# Patient Record
Sex: Male | Born: 1970 | Race: Black or African American | Hispanic: No | Marital: Single | Smoking: Former smoker
Health system: Southern US, Community
[De-identification: ages and names within clinical notes are randomized; demographics above are authoritative.]

## PROBLEM LIST (undated history)

## (undated) DIAGNOSIS — I10 Essential (primary) hypertension: Secondary | ICD-10-CM

## (undated) DIAGNOSIS — K219 Gastro-esophageal reflux disease without esophagitis: Secondary | ICD-10-CM

## (undated) DIAGNOSIS — G839 Paralytic syndrome, unspecified: Secondary | ICD-10-CM

## (undated) DIAGNOSIS — K222 Esophageal obstruction: Secondary | ICD-10-CM

## (undated) HISTORY — DX: Essential (primary) hypertension: I10

## (undated) HISTORY — DX: Esophageal obstruction: K22.2

## (undated) HISTORY — PX: THORACIC OUTLET SURGERY: SHX2502

## (undated) HISTORY — PX: OTHER SURGICAL HISTORY: SHX169

## (undated) HISTORY — PX: REPAIR CRANIAL DEFECT SIMPLE: SUR683

---

## 1998-04-12 ENCOUNTER — Emergency Department (HOSPITAL_COMMUNITY): Admission: EM | Admit: 1998-04-12 | Discharge: 1998-04-12 | Payer: Self-pay | Admitting: Emergency Medicine

## 1999-07-12 ENCOUNTER — Emergency Department (HOSPITAL_COMMUNITY): Admission: EM | Admit: 1999-07-12 | Discharge: 1999-07-12 | Payer: Self-pay | Admitting: Emergency Medicine

## 2000-09-04 ENCOUNTER — Emergency Department (HOSPITAL_COMMUNITY): Admission: EM | Admit: 2000-09-04 | Discharge: 2000-09-04 | Payer: Self-pay | Admitting: Emergency Medicine

## 2001-02-16 ENCOUNTER — Encounter: Payer: Self-pay | Admitting: Internal Medicine

## 2001-02-16 ENCOUNTER — Emergency Department (HOSPITAL_COMMUNITY): Admission: EM | Admit: 2001-02-16 | Discharge: 2001-02-16 | Payer: Self-pay | Admitting: Internal Medicine

## 2002-01-18 ENCOUNTER — Emergency Department (HOSPITAL_COMMUNITY): Admission: EM | Admit: 2002-01-18 | Discharge: 2002-01-18 | Payer: Self-pay | Admitting: Emergency Medicine

## 2002-12-22 ENCOUNTER — Emergency Department (HOSPITAL_COMMUNITY): Admission: EM | Admit: 2002-12-22 | Discharge: 2002-12-23 | Payer: Self-pay

## 2003-07-15 ENCOUNTER — Emergency Department (HOSPITAL_COMMUNITY): Admission: EM | Admit: 2003-07-15 | Discharge: 2003-07-15 | Payer: Self-pay | Admitting: Emergency Medicine

## 2010-01-23 ENCOUNTER — Emergency Department (HOSPITAL_COMMUNITY): Admission: EM | Admit: 2010-01-23 | Discharge: 2010-01-24 | Payer: Self-pay | Admitting: Emergency Medicine

## 2010-07-19 ENCOUNTER — Emergency Department (HOSPITAL_COMMUNITY): Admission: EM | Admit: 2010-07-19 | Discharge: 2010-07-19 | Payer: Self-pay | Admitting: Emergency Medicine

## 2010-11-02 LAB — DIFFERENTIAL
Basophils Absolute: 0 10*3/uL (ref 0.0–0.1)
Basophils Relative: 0 % (ref 0–1)
Eosinophils Absolute: 0.2 10*3/uL (ref 0.0–0.7)
Eosinophils Relative: 3 % (ref 0–5)
Lymphocytes Relative: 4 % — ABNORMAL LOW (ref 12–46)
Lymphs Abs: 0.3 10*3/uL — ABNORMAL LOW (ref 0.7–4.0)
Monocytes Absolute: 0.3 10*3/uL (ref 0.1–1.0)
Monocytes Relative: 4 % (ref 3–12)
Neutro Abs: 6.6 10*3/uL (ref 1.7–7.7)
Neutrophils Relative %: 90 % — ABNORMAL HIGH (ref 43–77)

## 2010-11-02 LAB — COMPREHENSIVE METABOLIC PANEL
ALT: 20 U/L (ref 0–53)
AST: 22 U/L (ref 0–37)
Albumin: 4.3 g/dL (ref 3.5–5.2)
Alkaline Phosphatase: 51 U/L (ref 39–117)
BUN: 12 mg/dL (ref 6–23)
CO2: 29 mEq/L (ref 19–32)
Calcium: 9.4 mg/dL (ref 8.4–10.5)
Chloride: 105 mEq/L (ref 96–112)
Creatinine, Ser: 1.1 mg/dL (ref 0.4–1.5)
GFR calc Af Amer: >60 mL/min (ref >60)
GFR calc non Af Amer: >60 mL/min (ref >60)
Glucose, Bld: 111 mg/dL — ABNORMAL HIGH (ref 70–99)
Potassium: 4.1 mEq/L (ref 3.5–5.1)
Sodium: 141 mEq/L (ref 135–145)
Total Bilirubin: 0.6 mg/dL (ref 0.3–1.2)
Total Protein: 7.5 g/dL (ref 6.0–8.3)

## 2010-11-02 LAB — CBC
HCT: 48.1 % (ref 39.0–52.0)
Hemoglobin: 16.3 g/dL (ref 13.0–17.0)
MCH: 33.7 pg (ref 26.0–34.0)
MCHC: 33.9 g/dL (ref 30.0–36.0)
MCV: 99.5 fL (ref 78.0–100.0)
Platelets: 220 10*3/uL (ref 150–400)
RBC: 4.83 MIL/uL (ref 4.22–5.81)
RDW: 12.7 % (ref 11.5–15.5)
WBC: 7.3 10*3/uL (ref 4.0–10.5)

## 2010-11-02 LAB — POCT CARDIAC MARKERS
CKMB, poc: <1 ng/mL — ABNORMAL LOW (ref 1.0–8.0)
CKMB, poc: <1 ng/mL — ABNORMAL LOW (ref 1.0–8.0)
Myoglobin, poc: 49.8 ng/mL (ref 12–200)
Myoglobin, poc: 61.9 ng/mL (ref 12–200)
Troponin i, poc: <0.05 ng/mL (ref 0.00–0.09)
Troponin i, poc: <0.05 ng/mL (ref 0.00–0.09)

## 2010-11-02 LAB — LIPASE, BLOOD: Lipase: 23 U/L (ref 11–59)

## 2011-10-08 ENCOUNTER — Encounter (HOSPITAL_COMMUNITY)
Admission: EM | Disposition: A | Discharge status: Home / Self Care | Payer: Self-pay | Source: Home / Self Care | Attending: Emergency Medicine

## 2011-10-08 ENCOUNTER — Encounter (HOSPITAL_COMMUNITY): Payer: Self-pay | Admitting: *Deleted

## 2011-10-08 ENCOUNTER — Emergency Department (HOSPITAL_COMMUNITY)
Admission: EM | Admit: 2011-10-08 | Discharge: 2011-10-08 | Disposition: A | Discharge status: Home / Self Care | Payer: BC Managed Care – PPO | Attending: Emergency Medicine | Admitting: Emergency Medicine

## 2011-10-08 DIAGNOSIS — IMO0002 Reserved for concepts with insufficient information to code with codable children: Secondary | ICD-10-CM | POA: Insufficient documentation

## 2011-10-08 DIAGNOSIS — K209 Esophagitis, unspecified without bleeding: Secondary | ICD-10-CM | POA: Insufficient documentation

## 2011-10-08 DIAGNOSIS — R1310 Dysphagia, unspecified: Secondary | ICD-10-CM

## 2011-10-08 DIAGNOSIS — T18108A Unspecified foreign body in esophagus causing other injury, initial encounter: Secondary | ICD-10-CM | POA: Insufficient documentation

## 2011-10-08 DIAGNOSIS — R1319 Other dysphagia: Secondary | ICD-10-CM | POA: Insufficient documentation

## 2011-10-08 DIAGNOSIS — K222 Esophageal obstruction: Secondary | ICD-10-CM | POA: Insufficient documentation

## 2011-10-08 HISTORY — DX: Paralytic syndrome, unspecified: G83.9

## 2011-10-08 HISTORY — PX: ESOPHAGOGASTRODUODENOSCOPY: SHX5428

## 2011-10-08 SURGERY — EGD (ESOPHAGOGASTRODUODENOSCOPY)
Anesthesia: Moderate Sedation

## 2011-10-08 MED ORDER — SODIUM CHLORIDE 0.9 % IV SOLN
Freq: Once | INTRAVENOUS | Status: AC
  Administered 2011-10-08: 04:00:00 via INTRAVENOUS

## 2011-10-08 MED ORDER — FENTANYL NICU IV SYRINGE 50 MCG/ML
INJECTION | INTRAMUSCULAR | Status: DC | PRN
  Administered 2011-10-08 (×4): 25 ug via INTRAVENOUS

## 2011-10-08 MED ORDER — GLUCAGON HCL (RDNA) 1 MG IJ SOLR
1.0000 mg | Freq: Once | INTRAMUSCULAR | Status: AC
Start: 2011-10-08 — End: 2011-10-08
  Administered 2011-10-08: 1 mg via INTRAVENOUS
  Filled 2011-10-08: qty 1

## 2011-10-08 MED ORDER — BUTAMBEN-TETRACAINE-BENZOCAINE 2-2-14 % EX AERO
INHALATION_SPRAY | CUTANEOUS | Status: DC | PRN
  Administered 2011-10-08: 2 via TOPICAL

## 2011-10-08 MED ORDER — MIDAZOLAM HCL 10 MG/2ML IJ SOLN
INTRAMUSCULAR | Status: DC | PRN
  Administered 2011-10-08 (×5): 2 mg via INTRAVENOUS

## 2011-10-08 MED ORDER — SODIUM CHLORIDE 0.9 % IV SOLN
Freq: Once | INTRAVENOUS | Status: AC
  Administered 2011-10-08: 09:00:00 via INTRAVENOUS

## 2011-10-08 NOTE — ED Provider Notes (Signed)
History     CSN: [id number]  Arrival date & time 10/08/11  4098   First MD Initiated Contact with Patient 10/08/11 [phone]      Chief Complaint  Patient presents with  . Dysphagia  . Airway Obstruction    (Consider location/radiation/quality/duration/timing/severity/associated sxs/prior treatment) HPI Comments: Patient was eating chicken and rice last night when he began to cough and noticed that the food would only go so far before he vomited it back up since and has been able to swallow his own saliva.  This has happened in the past, but has always managed to clear the obstruction  The history is provided by the patient.    History reviewed. No pertinent past medical history.  Past Surgical History  Procedure Date  . Arm surgery   . Leg surgery   . Thoracic outlet surgery   . Repair cranial defect simple     History reviewed. No pertinent family history.  History  Substance Use Topics  . Smoking status: Not on file  . Smokeless tobacco: Not on file  . Alcohol Use: No      Review of Systems  Constitutional: Negative for fever and chills.  HENT: Positive for trouble swallowing. Negative for rhinorrhea and postnasal drip.   Respiratory: Negative for choking and shortness of breath.   Gastrointestinal: Positive for vomiting. Negative for abdominal pain.  Neurological: Negative for dizziness.    Allergies  Review of patient's allergies indicates no known allergies.  Home Medications  No current outpatient prescriptions on file.  BP 148/96  Temp(Src) 98.5 F (36.9 C) (Oral)  Resp 19  Ht 5' 7.5" (1.715 m)  Wt 178 lb (80.74 kg)  BMI 27.47 kg/m2  SpO2 100%  Physical Exam  Constitutional: He appears well-developed.  HENT:  Head: Normocephalic.  Eyes: Pupils are equal, round, and reactive to light.  Cardiovascular: Normal rate.   Pulmonary/Chest: Effort normal.  Abdominal: Soft.  Musculoskeletal: Normal range of motion.  Neurological: He is alert.  Skin:  Skin is warm.  Psychiatric: He has a normal mood and affect.    ED Course  Procedures (including critical care time)  Labs Reviewed - No data to display No results found.   No diagnosis found.  After IV, glucagon.  Patient was still unable to tolerate secretions or fluids  MDM  Will ask for an IV placed in administer 1 amp of glucagon rapid push reassessment if necessary repeat a second time.  If that is unsuccessful, will obtain esophagram        Arman Filter, NP 10/08/11 0403  Arman Filter, NP 10/08/11 4782  Arman Filter, NP 10/08/11 [phone]

## 2011-10-08 NOTE — H&P (Signed)
CSN: [id number]  Arrival date & time 10/08/11 4098  First MD Initiated Contact with Patient 10/08/11 [phone]  Chief Complaint   Patient presents with   .  Dysphagia   .  Airway Obstruction    (Consider location/radiation/quality/duration/timing/severity/associated sxs/prior  treatment)  HPI Comments: Patient was eating chicken and rice last night when he began to cough and noticed that the food would only go so far before he vomited it back up since and has been able to swallow his own saliva. This has happened in the past, but has always managed to clear the obstruction  The history is provided by the patient.   History reviewed. No pertinent past medical history.  Past Surgical History   Procedure  Date   .  Arm surgery    .  Leg surgery    .  Thoracic outlet surgery    .  Repair cranial defect simple     History reviewed. No pertinent family history.  History   Substance Use Topics   .  Smoking status:  Not on file   .  Smokeless tobacco:  Not on file   .  Alcohol Use:  No     Review of Systems  Constitutional: Negative for fever and chills.  HENT: Positive for trouble swallowing. Negative for rhinorrhea and postnasal drip.  Respiratory: Negative for choking and shortness of breath.  Gastrointestinal: Positive for vomiting. Negative for abdominal pain.  Neurological: Negative for dizziness.   Allergies   Review of patient's allergies indicates no known allergies.  Home Medications   No current outpatient prescriptions on file.  BP 148/96  Temp(Src) 98.5 F (36.9 C) (Oral)  Resp 19  Ht 5' 7.5" (1.715 m)  Wt 178 lb (80.74 kg)  BMI 27.47 kg/m2  SpO2 100%  Physical Exam  Constitutional: He appears well-developed.  HENT:  Head: Normocephalic.  Eyes: Pupils are equal, round, and reactive to light.  Cardiovascular: Normal rate.  Pulmonary/Chest: Effort normal.  Abdominal: Soft.  Musculoskeletal: Normal range of motion.  Neurological: He is alert.  Skin: Skin is warm.    Psychiatric: He has a normal mood and affect.   ED Course   Procedures (including critical care time)  Labs Reviewed - No data to display  No results found.  No diagnosis found.  After IV, glucagon. Patient was still unable to tolerate secretions or fluids  MDM   Will ask for an IV placed in administer 1 amp of glucagon rapid push reassessment if necessary repeat a second time. If that is unsuccessful, will obtain esophagram  Arman Filter, NP  10/08/11 0403

## 2011-10-08 NOTE — ED Provider Notes (Signed)
Medical screening examination/treatment/procedure(s) were conducted as a shared visit with non-physician practitioner(s) and myself.  I personally evaluated the patient during the encounter Patients with esophageal food is. Despite glucagon he is still unable to swallow he is in no acute distress with normal vital signs. Spoke with GI and they will come and remove it.  Gwyneth Sprout, MD 10/08/11 [phone]

## 2011-10-08 NOTE — ED Notes (Signed)
Pt states he "tries to swallow food or drink and it gets stuck half way down." pt states he feels like it doesn't go down. Pt states he gags himself to get it back up. Airway is intact and patent. No complaints of any obstruction

## 2011-10-08 NOTE — Interval H&P Note (Signed)
History and Physical Interval Note:  10/08/2011 1:20 PM  Kyle Donovan  has presented today for surgery, with the diagnosis of food impaction  The various methods of treatment have been discussed with the patient and family. After consideration of risks, benefits and other options for treatment, the patient has consented to  Procedure(s) (LRB): ESOPHAGOGASTRODUODENOSCOPY (EGD) (N/A) as a surgical intervention .  The patients' history has been reviewed, patient examined, no change in status, stable for surgery.  I have reviewed the patients' chart and labs.  Questions were answered to the patient's satisfaction.     Venita Lick. Russella Dar MD [facility]

## 2011-10-08 NOTE — Discharge Instructions (Signed)
Clear liquid diet for 6 hours then advance to a soft diet   Diet for GERD Nutrition therapy can help ease the discomfort of gastroesophageal reflux disease (GERD) and peptic ulcer disease (PUD).  HOME CARE INSTRUCTIONS   Eat your meals slowly, in a relaxed setting.   Eat 5 to 6 small meals per day.   If a food causes distress, stop eating it for a period of time.  FOODS TO AVOID  Coffee, regular or decaffeinated.   Cola beverages, regular or low calorie.   Tea, regular or decaffeinated.   Pepper.   Cocoa.   High fat foods, including meats.   Butter, margarine, hydrogenated oil (trans fats).   Peppermint or spearmint (if you have GERD).   Fruits and vegetables if not tolerated.   Alcohol.   Nicotine (smoking or chewing). This is one of the most potent stimulants to acid production in the gastrointestinal tract.   Any food that seems to aggravate your condition.  If you have questions regarding your diet, ask your caregiver or a registered dietitian. TIPS  Lying flat may make symptoms worse. Keep the head of your bed raised 6 to 9 inches (15 to 23 cm) by using a foam wedge or blocks under the legs of the bed.   Do not lay down until 3 hours after eating a meal.   Daily physical activity may help reduce symptoms.  MAKE SURE YOU:   Understand these instructions.   Will watch your condition.   Will get help right away if you are not doing well or get worse.  Document Released: 08/08/2005 Document Revised: 04/20/2011 Document Reviewed: 12/22/2008  [facility] Patient Information 2012 [place], [place]. Upper GI Endoscopy Upper GI endoscopy means using a flexible scope to look at the esophagus, stomach, and upper small bowel. This is done to make a diagnosis in people with heartburn, abdominal pain, or abnormal bleeding. Sometimes an endoscope is needed to remove foreign bodies or food that become stuck in the esophagus; it can also be used to take biopsy samples. For the  best results, do not eat or drink for 8 hours before having your upper endoscopy.  To perform the endoscopy, you will probably be sedated and your throat will be numbed with a special spray. The endoscope is then slowly passed down your throat (this will not interfere with your breathing). An endoscopy exam takes 15 to 30 minutes to complete and there is no real pain. Patients rarely remember much about the procedure. The results of the test may take several days if a biopsy or other test is taken.  You may have a sore throat after an endoscopy exam. Serious complications are very rare. Stick to liquids and soft foods until your pain is better. Do not drive a car or operate any dangerous equipment for at least 24 hours after being sedated. SEEK IMMEDIATE MEDICAL CARE IF:   You have severe throat pain.   You have shortness of breath.   You have bleeding problems.   You have a fever.   You have difficulty recovering from your sedation.  Document Released: 09/15/2004 Document Revised: 04/20/2011 Document Reviewed: 08/10/2008 [facility] Patient Information 2012 [place], [place].Endoscopy Care After Please read the instructions outlined below and refer to this sheet in the next few weeks. These discharge instructions provide you with general information on caring for yourself after you leave the hospital. Your doctor may also give you specific instructions. While your treatment has been planned according to the most  current medical practices available, unavoidable complications occasionally occur. If you have any problems or questions after discharge, please call your doctor. HOME CARE INSTRUCTIONS Activity  You may resume your regular activity but move at a slower pace for the next 24 hours.   Take frequent rest periods for the next 24 hours.   Walking will help expel (get rid of) the air and reduce the bloated feeling in your abdomen.   No driving for 24 hours (because of the anesthesia  (medicine) used during the test).   You may shower.   Do not sign any important legal documents or operate any machinery for 24 hours (because of the anesthesia used during the test).  Nutrition  Drink plenty of fluids.   You may resume your normal diet.   Begin with a light meal and progress to your normal diet.   Avoid alcoholic beverages for 24 hours or as instructed by your caregiver.  Medications You may resume your normal medications unless your caregiver tells you otherwise. What you can expect today  You may experience abdominal discomfort such as a feeling of fullness or "gas" pains.   You may experience a sore throat for 2 to 3 days. This is normal. Gargling with salt water may help this.  Follow-up Your doctor will discuss the results of your test with you. SEEK IMMEDIATE MEDICAL CARE IF:  You have excessive nausea (feeling sick to your stomach) and/or vomiting.   You have severe abdominal pain and distention (swelling).   You have trouble swallowing.   You have a temperature over 100 F (37.8 C).   You have rectal bleeding or vomiting of blood.  Document Released: 03/22/2004 Document Revised: 04/20/2011 Document Reviewed: 10/03/2007 [facility] Patient Information 2012 [place], [place].

## 2011-10-08 NOTE — ED Notes (Signed)
Pt states that he was eating dinner last night when he began to vomit.  Since that point, the pt feels as if his food gets stuck in his throat and that he cannot swallow correctly.

## 2011-10-08 NOTE — Op Note (Signed)
[facility] [address] [place], [place]  [address]  ENDOSCOPY PROCEDURE REPORT PATIENT:  Kyle Donovan, Kyle Donovan  MR#:  [id number] BIRTHDATE:  [birth year], 40 yrs. old  GENDER:  male ENDOSCOPIST:  Judie Petit T. Russella Dar, MD, [facility] Referred by:  Gwyneth Sprout, MD  ([facility] ED-unassigned) PROCEDURE DATE:  10/08/2011 PROCEDURE:  EGD with dilatation over guidewire ASA CLASS:  Class II INDICATIONS:  food impaction, dysphagia MEDICATIONS:  Fentanyl 100 mcg IV, Versed 10 mg IV TOPICAL ANESTHETIC:  Cetacaine Spray DESCRIPTION OF PROCEDURE:   After the risks benefits and alternatives of the procedure were thoroughly explained, informed consent was obtained.  The Pentax Gastroscope [id number] endoscope was introduced through the mouth and advanced to the second portion of the duodenum, without limitations. The gastric retroflexed photo did not capture. The instrument was slowly withdrawn as the mucosa was fully examined. <<PROCEDUREIMAGES>> Esophagitis was found in the distal esophagus. It was erosive, likely related to recent food impaction which apparently passed prior to EGD. Otherwise normal esophagus. A stricture was found at the gastroesophageal junction measring about 14 mm in diameter. Savary / guidewire 15 mm and 16 mm dilators were passed with minimal reisitance and minimal hemre on both dilators.  The stomach was entered and closely examined. The pylorus, antrum, angularis, and lesser curvature were well visualized, including a retroflexed view of the cardia and fundus. The stomach wall was normally distensable. The scope passed easily through the pylorus into the duodenum. The duodenal bulb was normal in appearance, as was the postbulbar duodenum.  Retroflexed views revealed no abnormalities.  The scope was then withdrawn from the patient and the procedure completed.  COMPLICATIONS:  None  ENDOSCOPIC IMPRESSION: 1) Esophagitis from food impaction and possibly GERD as well 2)  Stricture at the gastroesophageal junction  RECOMMENDATIONS: 1) Anti-reflux regimen long term 2) PPI qam: Prilosec 20mg  OTC po qam long term 3) Post dilation instructions  Rumi Kolodziej T. Russella Dar, MD, [facility]  n. eSIGNED:   Venita Lick. Railyn House at 10/08/2011 01:47 PM  Adah Salvage, [phone]

## 2011-10-10 ENCOUNTER — Encounter (HOSPITAL_COMMUNITY): Payer: Self-pay | Admitting: Gastroenterology

## 2011-10-10 MED FILL — Diphenhydramine HCl Inj 50 MG/ML: INTRAMUSCULAR | Qty: 1 | Status: AC

## 2011-11-14 ENCOUNTER — Encounter (HOSPITAL_COMMUNITY): Payer: Self-pay | Admitting: *Deleted

## 2011-11-14 ENCOUNTER — Emergency Department (HOSPITAL_COMMUNITY)
Admission: EM | Admit: 2011-11-14 | Discharge: 2011-11-14 | Disposition: A | Discharge status: Home / Self Care | Payer: BC Managed Care – PPO | Attending: Emergency Medicine | Admitting: Emergency Medicine

## 2011-11-14 DIAGNOSIS — R197 Diarrhea, unspecified: Secondary | ICD-10-CM

## 2011-11-14 DIAGNOSIS — Z87828 Personal history of other (healed) physical injury and trauma: Secondary | ICD-10-CM | POA: Insufficient documentation

## 2011-11-14 DIAGNOSIS — M6250 Muscle wasting and atrophy, not elsewhere classified, unspecified site: Secondary | ICD-10-CM | POA: Insufficient documentation

## 2011-11-14 DIAGNOSIS — R109 Unspecified abdominal pain: Secondary | ICD-10-CM | POA: Insufficient documentation

## 2011-11-14 LAB — CBC
HCT: 43.8 % (ref 39.0–52.0)
Hemoglobin: 14.8 g/dL (ref 13.0–17.0)
MCH: 32.1 pg (ref 26.0–34.0)
MCHC: 33.8 g/dL (ref 30.0–36.0)
MCV: 95 fL (ref 78.0–100.0)
Platelets: 227 10*3/uL (ref 150–400)
RBC: 4.61 MIL/uL (ref 4.22–5.81)
RDW: 12.2 % (ref 11.5–15.5)
WBC: 4.5 10*3/uL (ref 4.0–10.5)

## 2011-11-14 LAB — URINALYSIS, ROUTINE W REFLEX MICROSCOPIC
Bilirubin Urine: NEGATIVE
Glucose, UA: NEGATIVE mg/dL
Hgb urine dipstick: NEGATIVE
Leukocytes, UA: NEGATIVE
Nitrite: NEGATIVE
Protein, ur: NEGATIVE mg/dL
Specific Gravity, Urine: 1.038 — ABNORMAL HIGH (ref 1.005–1.030)
Urobilinogen, UA: 1 mg/dL (ref 0.0–1.0)
pH: 6 (ref 5.0–8.0)

## 2011-11-14 LAB — DIFFERENTIAL
Basophils Absolute: 0 10*3/uL (ref 0.0–0.1)
Basophils Relative: 0 % (ref 0–1)
Eosinophils Absolute: 0.2 10*3/uL (ref 0.0–0.7)
Eosinophils Relative: 4 % (ref 0–5)
Lymphocytes Relative: 24 % (ref 12–46)
Lymphs Abs: 1.1 10*3/uL (ref 0.7–4.0)
Monocytes Absolute: 0.4 10*3/uL (ref 0.1–1.0)
Monocytes Relative: 8 % (ref 3–12)
Neutro Abs: 2.8 10*3/uL (ref 1.7–7.7)
Neutrophils Relative %: 64 % (ref 43–77)

## 2011-11-14 LAB — COMPREHENSIVE METABOLIC PANEL
ALT: 19 U/L (ref 0–53)
AST: 22 U/L (ref 0–37)
Albumin: 3.9 g/dL (ref 3.5–5.2)
Alkaline Phosphatase: 55 U/L (ref 39–117)
BUN: 11 mg/dL (ref 6–23)
CO2: 28 mEq/L (ref 19–32)
Calcium: 9.3 mg/dL (ref 8.4–10.5)
Chloride: 104 mEq/L (ref 96–112)
Creatinine, Ser: 1 mg/dL (ref 0.50–1.35)
GFR calc Af Amer: >90 mL/min (ref >90)
GFR calc non Af Amer: >90 mL/min (ref >90)
Glucose, Bld: 79 mg/dL (ref 70–99)
Potassium: 3 mEq/L — ABNORMAL LOW (ref 3.5–5.1)
Sodium: 140 mEq/L (ref 135–145)
Total Bilirubin: 0.3 mg/dL (ref 0.3–1.2)
Total Protein: 7.9 g/dL (ref 6.0–8.3)

## 2011-11-14 LAB — LIPASE, BLOOD: Lipase: 22 U/L (ref 11–59)

## 2011-11-14 MED ORDER — LOPERAMIDE HCL 2 MG PO CAPS: 2.0000 mg | ORAL_CAPSULE | Freq: Four times a day (QID) | ORAL | Status: AC | PRN

## 2011-11-14 MED ORDER — ONDANSETRON HCL 4 MG/2ML IJ SOLN
4.0000 mg | Freq: Once | INTRAMUSCULAR | Status: AC
  Administered 2011-11-14: 4 mg via INTRAVENOUS
  Filled 2011-11-14: qty 2

## 2011-11-14 MED ORDER — SODIUM CHLORIDE 0.9 % IV SOLN: 1000.0000 mL | INTRAVENOUS | Status: DC

## 2011-11-14 MED ORDER — KETOROLAC TROMETHAMINE 30 MG/ML IJ SOLN
30.0000 mg | Freq: Once | INTRAMUSCULAR | Status: AC
  Administered 2011-11-14: 30 mg via INTRAVENOUS
  Filled 2011-11-14: qty 1

## 2011-11-14 MED ORDER — SODIUM CHLORIDE 0.9 % IV SOLN
1000.0000 mL | Freq: Once | INTRAVENOUS | Status: AC
  Administered 2011-11-14: 1000 mL via INTRAVENOUS

## 2011-11-14 MED ORDER — DICYCLOMINE HCL 20 MG PO TABS: 20.0000 mg | ORAL_TABLET | Freq: Two times a day (BID) | ORAL | Status: DC

## 2011-11-14 NOTE — ED Notes (Signed)
Received report, pt. Alert and oriented, NAD noted, pt. C/o diarrhea and flank pain

## 2011-11-14 NOTE — Discharge Instructions (Signed)
Diet for Diarrhea, Adult Having frequent, runny stools (diarrhea) has many causes. Diarrhea may be caused or worsened by food or drink. Diarrhea may be relieved by changing your diet. IF YOU ARE NOT TOLERATING SOLID FOODS:  Drink enough water and fluids to keep your urine clear or pale yellow.   Avoid sugary drinks and sodas as well as milk-based beverages.   Avoid beverages containing caffeine and alcohol.   You may try rehydrating beverages. You can make your own by following this recipe:    tsp table salt.    tsp baking soda.   ? tsp salt substitute (potassium chloride).   1 tbs + 1 tsp sugar.   1 qt water.  As your stools become more solid, you can start eating solid foods. Add foods one at a time. If a certain food causes your diarrhea to get worse, avoid that food and try other foods. A low fiber, low-fat, and lactose-free diet is recommended. Small, frequent meals may be better tolerated.  Starches  Allowed:  White, French, and pita breads, plain rolls, buns, bagels. Plain muffins, matzo. Soda, saltine, or graham crackers. Pretzels, melba toast, zwieback. Cooked cereals made with water: cornmeal, farina, cream cereals. Dry cereals: refined corn, wheat, rice. Potatoes prepared any way without skins, refined macaroni, spaghetti, noodles, refined rice.   Avoid:  Bread, rolls, or crackers made with whole wheat, multi-grains, rye, bran seeds, nuts, or coconut. Corn tortillas or taco shells. Cereals containing whole grains, multi-grains, bran, coconut, nuts, or raisins. Cooked or dry oatmeal. Coarse wheat cereals, granola. Cereals advertised as "high-fiber." Potato skins. Whole grain pasta, wild or brown rice. Popcorn. Sweet potatoes/yams. Sweet rolls, doughnuts, waffles, pancakes, sweet breads.  Vegetables  Allowed: Strained tomato and vegetable juices. Most well-cooked and canned vegetables without seeds. Fresh: Tender lettuce, cucumber without the skin, cabbage, spinach, bean  sprouts.   Avoid: Fresh, cooked, or canned: Artichokes, baked beans, beet greens, broccoli, Brussels sprouts, corn, kale, legumes, peas, sweet potatoes. Cooked: Green or red cabbage, spinach. Avoid large servings of any vegetables, because vegetables shrink when cooked, and they contain more fiber per serving than fresh vegetables.  Fruit  Allowed: All fruit juices except prune juice. Cooked or canned: Apricots, applesauce, cantaloupe, cherries, fruit cocktail, grapefruit, grapes, kiwi, mandarin oranges, peaches, pears, plums, watermelon. Fresh: Apples without skin, ripe banana, grapes, cantaloupe, cherries, grapefruit, peaches, oranges, plums. Keep servings limited to  cup or 1 piece.   Avoid: Fresh: Apple with skin, apricots, mango, pears, raspberries, strawberries. Prune juice, stewed or dried prunes. Dried fruits, raisins, dates. Large servings of all fresh fruits.  Meat and Meat Substitutes  Allowed: Ground or well-cooked tender beef, ham, veal, lamb, pork, or poultry. Eggs, plain cheese. Fish, oysters, shrimp, lobster, other seafoods. Liver, organ meats.   Avoid: Tough, fibrous meats with gristle. Peanut butter, smooth or chunky. Cheese, nuts, seeds, legumes, dried peas, beans, lentils.  Milk  Allowed: Yogurt, lactose-free milk, kefir, drinkable yogurt, buttermilk, soy milk.   Avoid: Milk, chocolate milk, beverages made with milk, such as milk shakes.  Soups  Allowed: Bouillon, broth, or soups made from allowed foods. Any strained soup.   Avoid: Soups made from vegetables that are not allowed, cream or milk-based soups.  Desserts and Sweets  Allowed: Sugar-free gelatin, sugar-free frozen ice pops made without sugar alcohol.   Avoid: Plain cakes and cookies, pie made with allowed fruit, pudding, custard, cream pie. Gelatin, fruit, ice, sherbet, frozen ice pops. Ice cream, ice milk without nuts. Plain hard candy,   honey, jelly, molasses, syrup, sugar, chocolate syrup, gumdrops,  marshmallows.  Fats and Oils  Allowed: Avoid any fats and oils.   Avoid: Seeds, nuts, olives, avocados. Margarine, butter, cream, mayonnaise, salad oils, plain salad dressings made from allowed foods. Plain gravy, crisp bacon without rind.  Beverages  Allowed: Water, decaffeinated teas, oral rehydration solutions, sugar-free beverages.   Avoid: Fruit juices, caffeinated beverages (coffee, tea, soda or pop), alcohol, sports drinks, or lemon-lime soda or pop.  Condiments  Allowed: Ketchup, mustard, horseradish, vinegar, cream sauce, cheese sauce, cocoa powder. Spices in moderation: allspice, basil, bay leaves, celery powder or leaves, cinnamon, cumin powder, curry powder, ginger, mace, marjoram, onion or garlic powder, oregano, paprika, parsley flakes, ground pepper, rosemary, sage, savory, tarragon, thyme, turmeric.   Avoid: Coconut, honey.  Weight Monitoring: Weigh yourself every day. You should weigh yourself in the morning after you urinate and before you eat breakfast. Wear the same amount of clothing when you weigh yourself. Record your weight daily. Bring your recorded weights to your clinic visits. Tell your caregiver right away if you have gained 3 lb/1.4 kg or more in 1 day, 5 lb/2.3 kg in a week, or whatever amount you were told to report. SEEK IMMEDIATE MEDICAL CARE IF:   You are unable to keep fluids down.   You start to throw up (vomit) or diarrhea keeps coming back (persistent).   Abdominal pain develops, increases, or can be felt in one place (localizes).   You have an oral temperature above 102 F (38.9 C), not controlled by medicine.   Diarrhea contains blood or mucus.   You develop excessive weakness, dizziness, fainting, or extreme thirst.  MAKE SURE YOU:   Understand these instructions.   Will watch your condition.   Will get help right away if you are not doing well or get worse.  Document Released: 10/29/2003 Document Revised: 07/28/2011 Document Reviewed:  02/19/2009 [facility] Patient Information 2012 [place], [place].Diet for Diarrhea, Infants and Children Having frequent, runny stools (diarrhea) has many causes. Diarrhea may be caused or worsened by food or drink. Feeding your infant or child the right foods is recommended when he or she has diarrhea. During an illness, diarrhea may continue for 3 to 7 days. It is easy for a child with diarrhea to lose too much fluid from the body (dehydration). Fluids that are lost need to be replaced. Make sure your child drinks enough water and fluids to keep the urine clear or pale yellow. NUTRITION FOR INFANTS WITH DIARRHEA  Continue to feed infants breast milk or full-strength formula as usual.   You do not need to change to a lactose-free or soy formula unless you have been told to do so by your infant's caregiver.   Oral rehydration solutions (ORS) may be used to help keep your infant hydrated. Infants should not be given juices, sports drinks, or soda or pop. These drinks can make diarrhea worse.   If your infant has been taking some table foods, a few choices that are tolerated well are rice, peas, potatoes, chicken, or eggs. They should feel and look the same as foods you would usually give.  NUTRITION FOR CHILDREN WITH DIARRHEA  Continue to feed your child a healthy, balanced diet as usual.   Foods that may be better tolerated during illness with diarrhea are:   Starchy foods, such as rice, toast, pasta, low-sugar cereal, oatmeal, grits, baked potatoes, crackers, and bagels.   Low-fat milk (for children over 58 years of age).  Bananas or applesauce.   High fat and high sugar foods are not tolerated well.   It is important to give your child plenty of fluids when he or she has diarrhea. Recommended drinks are water, oral rehydration solutions, and dairy.   You may make your own ORS by following this recipe:    tsp table salt.    tsp baking soda.   ? tsp salt substitute (potassium  chloride).   1 tbs + 1 tsp sugar.   1 qt water.  SEEK IMMEDIATE MEDICAL CARE IF:   Your child is unable to keep fluids down.   Your child starts to throw up (vomit) or diarrhea keeps coming back.   Abdominal pain develops, increases, or can be felt in one place (localizes).   Diarrhea becomes excessive or contains blood or mucus.   Your child develops excessive weakness, dizziness, fainting, or extreme thirst.   Your child has an oral temperature above 102 F (38.9 C), not controlled by medicine.   Your baby is older than 3 months with a rectal temperature of 102 F (38.9 C) or higher.   Your baby is 76 months old or younger with a rectal temperature of 100.4 F (38 C) or higher.  MAKE SURE YOU:   Understand these instructions.   Watch your child's condition.   Get help right away if your child is not doing well or gets worse.  Document Released: 10/29/2003 Document Revised: 07/28/2011 Document Reviewed: 02/19/2009 [facility] Patient Information 2012 [place], [place].
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
## 2011-11-14 NOTE — ED Notes (Signed)
Pt reports diarrhea since Friday. Denies blood in stool. Also c/o flank, "kidney" pain after using bathroom. C/o nausea, denies vomiting.

## 2011-11-14 NOTE — ED Notes (Signed)
Pt. Alert and oriented, discharged to home, pt. Ambulatory, gait steady

## 2012-05-29 ENCOUNTER — Encounter: Payer: Self-pay | Admitting: Internal Medicine

## 2012-05-29 ENCOUNTER — Other Ambulatory Visit (INDEPENDENT_AMBULATORY_CARE_PROVIDER_SITE_OTHER): Payer: BC Managed Care – PPO

## 2012-05-29 ENCOUNTER — Ambulatory Visit (INDEPENDENT_AMBULATORY_CARE_PROVIDER_SITE_OTHER): Payer: BC Managed Care – PPO | Admitting: Internal Medicine

## 2012-05-29 VITALS — BP 140/90 | HR 69 | Temp 98.7°F | Resp 16 | Ht 67.0 in | Wt 188.5 lb

## 2012-05-29 DIAGNOSIS — Z23 Encounter for immunization: Secondary | ICD-10-CM

## 2012-05-29 DIAGNOSIS — Z0000 Encounter for general adult medical examination without abnormal findings: Secondary | ICD-10-CM

## 2012-05-29 DIAGNOSIS — I10 Essential (primary) hypertension: Secondary | ICD-10-CM

## 2012-05-29 LAB — COMPREHENSIVE METABOLIC PANEL
ALT: 17 U/L (ref 0–53)
AST: 22 U/L (ref 0–37)
Albumin: 4.1 g/dL (ref 3.5–5.2)
Alkaline Phosphatase: 52 U/L (ref 39–117)
BUN: 10 mg/dL (ref 6–23)
CO2: 31 mEq/L (ref 19–32)
Calcium: 9.7 mg/dL (ref 8.4–10.5)
Chloride: 103 mEq/L (ref 96–112)
Creatinine, Ser: 1 mg/dL (ref 0.4–1.5)
GFR: 107.43 mL/min (ref >60.00)
Glucose, Bld: 87 mg/dL (ref 70–99)
Potassium: 4 mEq/L (ref 3.5–5.1)
Sodium: 141 mEq/L (ref 135–145)
Total Bilirubin: 0.8 mg/dL (ref 0.3–1.2)
Total Protein: 7.8 g/dL (ref 6.0–8.3)

## 2012-05-29 LAB — CBC WITH DIFFERENTIAL/PLATELET
Basophils Absolute: 0 10*3/uL (ref 0.0–0.1)
Basophils Relative: 0.7 % (ref 0.0–3.0)
Eosinophils Absolute: 0.4 10*3/uL (ref 0.0–0.7)
Eosinophils Relative: 7.9 % — ABNORMAL HIGH (ref 0.0–5.0)
HCT: 41.5 % (ref 39.0–52.0)
Hemoglobin: 13.5 g/dL (ref 13.0–17.0)
Lymphocytes Relative: 42.9 % (ref 12.0–46.0)
Lymphs Abs: 2.2 10*3/uL (ref 0.7–4.0)
MCHC: 32.5 g/dL (ref 30.0–36.0)
MCV: 98.7 fl (ref 78.0–100.0)
Monocytes Absolute: 0.4 10*3/uL (ref 0.1–1.0)
Monocytes Relative: 7.9 % (ref 3.0–12.0)
Neutro Abs: 2.1 10*3/uL (ref 1.4–7.7)
Neutrophils Relative %: 40.6 % — ABNORMAL LOW (ref 43.0–77.0)
Platelets: 263 10*3/uL (ref 150.0–400.0)
RBC: 4.2 Mil/uL — ABNORMAL LOW (ref 4.22–5.81)
RDW: 12.5 % (ref 11.5–14.6)
WBC: 5.2 10*3/uL (ref 4.5–10.5)

## 2012-05-29 LAB — LIPID PANEL
Cholesterol: 172 mg/dL (ref 0–200)
HDL: 78.7 mg/dL (ref >39.00)
LDL Cholesterol: 85 mg/dL (ref 0–99)
Total CHOL/HDL Ratio: 2
Triglycerides: 42 mg/dL (ref 0.0–149.0)
VLDL: 8.4 mg/dL (ref 0.0–40.0)

## 2012-05-29 LAB — PSA: PSA: 1.1 ng/mL (ref 0.10–4.00)

## 2012-05-29 LAB — TSH: TSH: 1.82 u[IU]/mL (ref 0.35–5.50)

## 2012-05-29 NOTE — Progress Notes (Signed)
  Subjective:    Patient ID: Kyle Donovan, male    DOB: Dec 10, 1970, 41 y.o.   MRN: [id number]  HPI  New to me for a physical - he feels well and offers no complaints.  Review of Systems  Constitutional: Negative.   HENT: Negative.   Eyes: Negative.   Respiratory: Negative.   Cardiovascular: Negative.   Gastrointestinal: Negative.   Genitourinary: Negative.   Musculoskeletal: Negative.   Skin: Negative.   Neurological: Negative.   Hematological: Negative.   Psychiatric/Behavioral: Negative.        Objective:   Physical Exam  Vitals reviewed. Constitutional: He is oriented to person, place, and time. He appears well-developed and well-nourished. No distress.  HENT:  Head: Normocephalic and atraumatic.  Mouth/Throat: Oropharynx is clear and moist. No oropharyngeal exudate.  Eyes: Conjunctivae normal are normal. Right eye exhibits no discharge. Left eye exhibits no discharge. No scleral icterus.  Neck: Normal range of motion. Neck supple. No JVD present. No tracheal deviation present. No thyromegaly present.  Cardiovascular: Normal rate, regular rhythm, normal heart sounds and intact distal pulses.  Exam reveals no gallop and no friction rub.   No murmur heard. Pulmonary/Chest: Effort normal and breath sounds normal. No stridor. No respiratory distress. He has no wheezes. He has no rales. He exhibits no tenderness.  Abdominal: Soft. Bowel sounds are normal. He exhibits no distension and no mass. There is no tenderness. There is no rebound and no guarding. Hernia confirmed negative in the right inguinal area and confirmed negative in the left inguinal area.  Genitourinary: Rectum normal, prostate normal, testes normal and penis normal. Rectal exam shows no external hemorrhoid, no internal hemorrhoid, no fissure, no mass, no tenderness and anal tone normal. Guaiac negative stool. Prostate is not enlarged and not tender. Right testis shows no mass, no swelling and no tenderness.  Right testis is descended. Left testis shows no mass, no swelling and no tenderness. Left testis is descended. Circumcised. No penile erythema or penile tenderness. No discharge found.  Musculoskeletal: Normal range of motion. He exhibits no edema and no tenderness.  Lymphadenopathy:    He has no cervical adenopathy.       Right: No inguinal adenopathy present.       Left: No inguinal adenopathy present.  Neurological: He is alert and oriented to person, place, and time. He displays atrophy (right arm). He displays no tremor. No cranial nerve deficit or sensory deficit. He exhibits abnormal muscle tone (right arm). He displays a negative Romberg sign. He displays no seizure activity. Coordination and gait normal.  Skin: Skin is warm and dry. No rash noted. He is not diaphoretic. No erythema. No pallor.  Psychiatric: He has a normal mood and affect. His behavior is normal. Judgment and thought content normal.     Lab Results  Component Value Date   WBC 4.5 11/14/2011   HGB 14.8 11/14/2011   HCT 43.8 11/14/2011   PLT 227 11/14/2011   GLUCOSE 79 11/14/2011   ALT 19 11/14/2011   AST 22 11/14/2011   NA 140 11/14/2011   K 3.0* 11/14/2011   CL 104 11/14/2011   CREATININE 1.00 11/14/2011   BUN 11 11/14/2011   CO2 28 11/14/2011       Assessment & Plan:

## 2012-05-29 NOTE — Assessment & Plan Note (Signed)
His BP is not high enough to require medical therapy, I will check his labs today to look for secondary and end organ damage

## 2012-05-29 NOTE — Patient Instructions (Signed)
Health Maintenance, Males A healthy lifestyle and preventative care can promote health and wellness.  Maintain regular health, dental, and eye exams.  Eat a healthy diet. Foods like vegetables, fruits, whole grains, low-fat dairy products, and lean protein foods contain the nutrients you need without too many calories. Decrease your intake of foods high in solid fats, added sugars, and salt. Get information about a proper diet from your caregiver, if necessary.  Regular physical exercise is one of the most important things you can do for your health. Most adults should get at least 150 minutes of moderate-intensity exercise (any activity that increases your heart rate and causes you to sweat) each week. In addition, most adults need muscle-strengthening exercises on 2 or more days a week.   Maintain a healthy weight. The body mass index (BMI) is a screening tool to identify possible weight problems. It provides an estimate of body fat based on height and weight. Your caregiver can help determine your BMI, and can help you achieve or maintain a healthy weight. For adults 20 years and older:  A BMI below 18.5 is considered underweight.  A BMI of 18.5 to 24.9 is normal.  A BMI of 25 to 29.9 is considered overweight.  A BMI of 30 and above is considered obese.  Maintain normal blood lipids and cholesterol by exercising and minimizing your intake of saturated fat. Eat a balanced diet with plenty of fruits and vegetables. Blood tests for lipids and cholesterol should begin at age 20 and be repeated every 5 years. If your lipid or cholesterol levels are high, you are over 50, or you are a high risk for heart disease, you may need your cholesterol levels checked more frequently.Ongoing high lipid and cholesterol levels should be treated with medicines, if diet and exercise are not effective.  If you smoke, find out from your caregiver how to quit. If you do not use tobacco, do not start.  If you  choose to drink alcohol, do not exceed 2 drinks per day. One drink is considered to be 12 ounces (355 mL) of beer, 5 ounces (148 mL) of wine, or 1.5 ounces (44 mL) of liquor.  Avoid use of street drugs. Do not share needles with anyone. Ask for help if you need support or instructions about stopping the use of drugs.  High blood pressure causes heart disease and increases the risk of stroke. Blood pressure should be checked at least every 1 to 2 years. Ongoing high blood pressure should be treated with medicines if weight loss and exercise are not effective.  If you are 45 to 40 years old, ask your caregiver if you should take aspirin to prevent heart disease.  Diabetes screening involves taking a blood sample to check your fasting blood sugar level. This should be done once every 3 years, after age 45, if you are within normal weight and without risk factors for diabetes. Testing should be considered at a younger age or be carried out more frequently if you are overweight and have at least 1 risk factor for diabetes.  Colorectal cancer can be detected and often prevented. Most routine colorectal cancer screening begins at the age of 50 and continues through age 75. However, your caregiver may recommend screening at an earlier age if you have risk factors for colon cancer. On a yearly basis, your caregiver may provide home test kits to check for hidden blood in the stool. Use of a small camera at the end of a tube,   to directly examine the colon (sigmoidoscopy or colonoscopy), can detect the earliest forms of colorectal cancer. Talk to your caregiver about this at age 50, when routine screening begins. Direct examination of the colon should be repeated every 5 to 10 years through age 75, unless early forms of pre-cancerous polyps or small growths are found.  Hepatitis C blood testing is recommended for all people born from 1945 through 1965 and any individual with known risks for hepatitis C.  Healthy  men should no longer receive prostate-specific antigen (PSA) blood tests as part of routine cancer screening. Consult with your caregiver about prostate cancer screening.  Testicular cancer screening is not recommended for adolescents or adult males who have no symptoms. Screening includes self-exam, caregiver exam, and other screening tests. Consult with your caregiver about any symptoms you have or any concerns you have about testicular cancer.  Practice safe sex. Use condoms and avoid high-risk sexual practices to reduce the spread of sexually transmitted infections (STIs).  Use sunscreen with a sun protection factor (SPF) of 30 or greater. Apply sunscreen liberally and repeatedly throughout the day. You should seek shade when your shadow is shorter than you. Protect yourself by wearing long sleeves, pants, a wide-brimmed hat, and sunglasses year round, whenever you are outdoors.  Notify your caregiver of new moles or changes in moles, especially if there is a change in shape or color. Also notify your caregiver if a mole is larger than the size of a pencil eraser.  A one-time screening for abdominal aortic aneurysm (AAA) and surgical repair of large AAAs by sound wave imaging (ultrasonography) is recommended for ages 65 to 75 years who are current or former smokers.  Stay current with your immunizations. Document Released: 02/04/2008 Document Revised: 10/31/2011 Document Reviewed: 01/03/2011 [employer] Patient Information 2013 [employer].  

## 2012-05-29 NOTE — Assessment & Plan Note (Signed)
Exam done, vaccines were updated, labs ordered, pt ed material was given 

## 2012-05-30 ENCOUNTER — Encounter: Payer: Self-pay | Admitting: Internal Medicine

## 2012-07-26 ENCOUNTER — Emergency Department (HOSPITAL_COMMUNITY)
Admission: EM | Admit: 2012-07-26 | Discharge: 2012-07-26 | Disposition: A | Discharge status: Home / Self Care | Payer: BC Managed Care – PPO | Attending: Emergency Medicine | Admitting: Emergency Medicine

## 2012-07-26 ENCOUNTER — Encounter (HOSPITAL_COMMUNITY): Payer: Self-pay | Admitting: Emergency Medicine

## 2012-07-26 ENCOUNTER — Emergency Department (HOSPITAL_COMMUNITY): Payer: BC Managed Care – PPO

## 2012-07-26 DIAGNOSIS — T148XXA Other injury of unspecified body region, initial encounter: Secondary | ICD-10-CM

## 2012-07-26 DIAGNOSIS — Y9389 Activity, other specified: Secondary | ICD-10-CM | POA: Insufficient documentation

## 2012-07-26 DIAGNOSIS — Y9289 Other specified places as the place of occurrence of the external cause: Secondary | ICD-10-CM | POA: Insufficient documentation

## 2012-07-26 DIAGNOSIS — J45909 Unspecified asthma, uncomplicated: Secondary | ICD-10-CM | POA: Insufficient documentation

## 2012-07-26 DIAGNOSIS — IMO0002 Reserved for concepts with insufficient information to code with codable children: Secondary | ICD-10-CM | POA: Insufficient documentation

## 2012-07-26 DIAGNOSIS — W268XXA Contact with other sharp object(s), not elsewhere classified, initial encounter: Secondary | ICD-10-CM | POA: Insufficient documentation

## 2012-07-26 DIAGNOSIS — I10 Essential (primary) hypertension: Secondary | ICD-10-CM | POA: Insufficient documentation

## 2012-07-26 DIAGNOSIS — F17200 Nicotine dependence, unspecified, uncomplicated: Secondary | ICD-10-CM | POA: Insufficient documentation
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
## 2012-07-26 NOTE — ED Notes (Signed)
Pt states a few days ago he injured his lt middle finger. Abrasion to knuckle beginning to have a scab on the area, pt is able to move all extremities.

## 2012-07-28 NOTE — ED Provider Notes (Signed)
Medical screening examination/treatment/procedure(s) were performed by non-physician practitioner and as supervising physician I was immediately available for consultation/collaboration.  Toy Baker, MD 07/28/12 [id number]

## 2012-10-07 ENCOUNTER — Emergency Department (HOSPITAL_COMMUNITY): Payer: Worker's Compensation

## 2012-10-07 ENCOUNTER — Emergency Department (HOSPITAL_COMMUNITY)
Admission: EM | Admit: 2012-10-07 | Discharge: 2012-10-07 | Disposition: A | Discharge status: Home / Self Care | Payer: Worker's Compensation | Attending: Emergency Medicine | Admitting: Emergency Medicine

## 2012-10-07 ENCOUNTER — Encounter (HOSPITAL_COMMUNITY): Payer: Self-pay | Admitting: *Deleted

## 2012-10-07 DIAGNOSIS — Z9889 Other specified postprocedural states: Secondary | ICD-10-CM | POA: Insufficient documentation

## 2012-10-07 DIAGNOSIS — I10 Essential (primary) hypertension: Secondary | ICD-10-CM | POA: Insufficient documentation

## 2012-10-07 DIAGNOSIS — Z87828 Personal history of other (healed) physical injury and trauma: Secondary | ICD-10-CM | POA: Insufficient documentation

## 2012-10-07 DIAGNOSIS — F17200 Nicotine dependence, unspecified, uncomplicated: Secondary | ICD-10-CM | POA: Insufficient documentation

## 2012-10-07 DIAGNOSIS — S62609A Fracture of unspecified phalanx of unspecified finger, initial encounter for closed fracture: Secondary | ICD-10-CM

## 2012-10-07 DIAGNOSIS — G8320 Monoplegia of upper limb affecting unspecified side: Secondary | ICD-10-CM | POA: Insufficient documentation

## 2012-10-07 DIAGNOSIS — IMO0002 Reserved for concepts with insufficient information to code with codable children: Secondary | ICD-10-CM | POA: Insufficient documentation

## 2012-10-07 DIAGNOSIS — Y9389 Activity, other specified: Secondary | ICD-10-CM | POA: Insufficient documentation

## 2012-10-07 DIAGNOSIS — R209 Unspecified disturbances of skin sensation: Secondary | ICD-10-CM | POA: Insufficient documentation

## 2012-10-07 DIAGNOSIS — Y9289 Other specified places as the place of occurrence of the external cause: Secondary | ICD-10-CM | POA: Insufficient documentation

## 2012-10-07 DIAGNOSIS — J45909 Unspecified asthma, uncomplicated: Secondary | ICD-10-CM | POA: Insufficient documentation

## 2012-10-07 MED ORDER — HYDROCODONE-ACETAMINOPHEN 5-325 MG PO TABS: 2.0000 | ORAL_TABLET | ORAL | Status: DC | PRN

## 2012-10-07 MED ORDER — KETOROLAC TROMETHAMINE 60 MG/2ML IM SOLN
60.0000 mg | Freq: Once | INTRAMUSCULAR | Status: AC
  Administered 2012-10-07: 60 mg via INTRAMUSCULAR
  Filled 2012-10-07: qty 2

## 2012-10-07 NOTE — ED Notes (Signed)
Ortho called for orders
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
## 2012-10-07 NOTE — ED Notes (Addendum)
Pt in c/o injury to right hand, states he hit it on something at work two days ago, noted increased swelling and redness, symptoms improved with ibuprofen but then return

## 2012-10-07 NOTE — ED Provider Notes (Signed)
Medical screening examination/treatment/procedure(s) were performed by non-physician practitioner and as supervising physician I was immediately available for consultation/collaboration.  Charles B. Bernette Mayers, MD 10/07/12 2333

## 2013-06-19 ENCOUNTER — Encounter (HOSPITAL_COMMUNITY): Payer: Self-pay | Admitting: Emergency Medicine

## 2013-06-19 ENCOUNTER — Emergency Department (HOSPITAL_COMMUNITY)
Admission: EM | Admit: 2013-06-19 | Discharge: 2013-06-19 | Disposition: A | Discharge status: Home / Self Care | Payer: BC Managed Care – PPO | Attending: Emergency Medicine | Admitting: Emergency Medicine

## 2013-06-19 DIAGNOSIS — Z8669 Personal history of other diseases of the nervous system and sense organs: Secondary | ICD-10-CM | POA: Insufficient documentation

## 2013-06-19 DIAGNOSIS — J45901 Unspecified asthma with (acute) exacerbation: Secondary | ICD-10-CM

## 2013-06-19 DIAGNOSIS — I10 Essential (primary) hypertension: Secondary | ICD-10-CM | POA: Insufficient documentation

## 2013-06-19 DIAGNOSIS — J029 Acute pharyngitis, unspecified: Secondary | ICD-10-CM | POA: Insufficient documentation

## 2013-06-19 DIAGNOSIS — F17200 Nicotine dependence, unspecified, uncomplicated: Secondary | ICD-10-CM | POA: Insufficient documentation

## 2013-06-19 MED ORDER — PREDNISONE 20 MG PO TABS
60.0000 mg | ORAL_TABLET | Freq: Once | ORAL | Status: AC
  Administered 2013-06-19: 60 mg via ORAL
  Filled 2013-06-19: qty 3

## 2013-06-19 MED ORDER — PREDNISONE (PAK) 10 MG PO TABS: 10.0000 mg | ORAL_TABLET | Freq: Every day | ORAL | Status: DC

## 2013-06-19 MED ORDER — ALBUTEROL SULFATE (5 MG/ML) 0.5% IN NEBU
5.0000 mg | INHALATION_SOLUTION | Freq: Once | RESPIRATORY_TRACT | Status: AC
  Administered 2013-06-19: 5 mg via RESPIRATORY_TRACT
  Filled 2013-06-19: qty 1

## 2013-06-19 MED ORDER — ALBUTEROL SULFATE HFA 108 (90 BASE) MCG/ACT IN AERS: 1.0000 | INHALATION_SPRAY | Freq: Four times a day (QID) | RESPIRATORY_TRACT | Status: DC | PRN

## 2013-06-19 MED ORDER — IPRATROPIUM BROMIDE 0.02 % IN SOLN
0.5000 mg | Freq: Once | RESPIRATORY_TRACT | Status: AC
  Administered 2013-06-19: 0.5 mg via RESPIRATORY_TRACT
  Filled 2013-06-19: qty 2.5

## 2013-06-19 MED ORDER — ALBUTEROL SULFATE HFA 108 (90 BASE) MCG/ACT IN AERS
2.0000 | INHALATION_SPRAY | Freq: Once | RESPIRATORY_TRACT | Status: AC
  Administered 2013-06-19: 2 via RESPIRATORY_TRACT
  Filled 2013-06-19: qty 6.7
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
## 2013-06-19 NOTE — ED Notes (Signed)
Pt reports having asthma flare up since Monday. States he used to get inhaler otc, cannot get it otc anymore. Does not have rx inhaler. States he has head cold, has productive cough with "green and brown" mucous, and body aches. Has been taking tylenol, thera-flu

## 2013-06-19 NOTE — ED Provider Notes (Signed)
Medical screening examination/treatment/procedure(s) were performed by non-physician practitioner and as supervising physician I was immediately available for consultation/collaboration.  EKG Interpretation   None         Richardean Canal, MD 06/19/13 2308

## 2013-06-19 NOTE — ED Notes (Signed)
He states he feels "better", and is breathing easily.

## 2013-06-26 ENCOUNTER — Encounter: Payer: Self-pay | Admitting: Internal Medicine

## 2013-07-08 ENCOUNTER — Other Ambulatory Visit (INDEPENDENT_AMBULATORY_CARE_PROVIDER_SITE_OTHER): Payer: BC Managed Care – PPO

## 2013-07-08 ENCOUNTER — Ambulatory Visit (INDEPENDENT_AMBULATORY_CARE_PROVIDER_SITE_OTHER): Payer: BC Managed Care – PPO | Admitting: Internal Medicine

## 2013-07-08 ENCOUNTER — Encounter: Payer: Self-pay | Admitting: Internal Medicine

## 2013-07-08 VITALS — BP 120/82 | HR 71 | Temp 98.5°F | Resp 16 | Ht 67.0 in | Wt 193.0 lb

## 2013-07-08 DIAGNOSIS — Z0000 Encounter for general adult medical examination without abnormal findings: Secondary | ICD-10-CM

## 2013-07-08 LAB — LIPID PANEL
Cholesterol: 162 mg/dL (ref 0–200)
HDL: 76.8 mg/dL (ref >39.00)
LDL Cholesterol: 78 mg/dL (ref 0–99)
Total CHOL/HDL Ratio: 2
Triglycerides: 34 mg/dL (ref 0.0–149.0)
VLDL: 6.8 mg/dL (ref 0.0–40.0)

## 2013-07-08 LAB — COMPREHENSIVE METABOLIC PANEL
ALT: 18 U/L (ref 0–53)
AST: 19 U/L (ref 0–37)
Albumin: 3.9 g/dL (ref 3.5–5.2)
Alkaline Phosphatase: 43 U/L (ref 39–117)
BUN: 7 mg/dL (ref 6–23)
CO2: 32 mEq/L (ref 19–32)
Calcium: 9.6 mg/dL (ref 8.4–10.5)
Chloride: 105 mEq/L (ref 96–112)
Creatinine, Ser: 1 mg/dL (ref 0.4–1.5)
GFR: 110.7 mL/min (ref >60.00)
Glucose, Bld: 101 mg/dL — ABNORMAL HIGH (ref 70–99)
Potassium: 4.5 mEq/L (ref 3.5–5.1)
Sodium: 142 mEq/L (ref 135–145)
Total Bilirubin: 0.8 mg/dL (ref 0.3–1.2)
Total Protein: 7.6 g/dL (ref 6.0–8.3)

## 2013-07-08 LAB — CBC WITH DIFFERENTIAL/PLATELET
Basophils Absolute: 0 10*3/uL (ref 0.0–0.1)
Basophils Relative: 0.5 % (ref 0.0–3.0)
Eosinophils Absolute: 0.4 10*3/uL (ref 0.0–0.7)
Eosinophils Relative: 8.7 % — ABNORMAL HIGH (ref 0.0–5.0)
HCT: 42 % (ref 39.0–52.0)
Hemoglobin: 14 g/dL (ref 13.0–17.0)
Lymphocytes Relative: 54.9 % — ABNORMAL HIGH (ref 12.0–46.0)
Lymphs Abs: 2.3 10*3/uL (ref 0.7–4.0)
MCHC: 33.3 g/dL (ref 30.0–36.0)
MCV: 96.4 fl (ref 78.0–100.0)
Monocytes Absolute: 0.4 10*3/uL (ref 0.1–1.0)
Monocytes Relative: 8.6 % (ref 3.0–12.0)
Neutro Abs: 1.1 10*3/uL — ABNORMAL LOW (ref 1.4–7.7)
Neutrophils Relative %: 27.3 % — ABNORMAL LOW (ref 43.0–77.0)
Platelets: 254 10*3/uL (ref 150.0–400.0)
RBC: 4.35 Mil/uL (ref 4.22–5.81)
RDW: 12.8 % (ref 11.5–14.6)
WBC: 4.1 10*3/uL — ABNORMAL LOW (ref 4.5–10.5)

## 2013-07-08 LAB — TSH: TSH: 1.27 u[IU]/mL (ref 0.35–5.50)

## 2013-07-08 NOTE — Progress Notes (Signed)
Pre visit review using our clinic review tool, if applicable. No additional management support is needed unless otherwise documented below in the visit note. 

## 2013-07-08 NOTE — Assessment & Plan Note (Signed)
Exam done He refused a flu vax and pneumovax Labs ordered Pt ed material was given

## 2013-07-08 NOTE — Progress Notes (Signed)
  Subjective:    Patient ID: Kyle Donovan, male    DOB: Jul 28, 1971, 42 y.o.   MRN: [id number]  HPI  He returns for a complete physical - he tells me that he feels well and offers no complaints.  Review of Systems  All other systems reviewed and are negative.       Objective:   Physical Exam  Vitals reviewed. Constitutional: He is oriented to person, place, and time. He appears well-developed and well-nourished. No distress.  HENT:  Head: Normocephalic and atraumatic.  Mouth/Throat: Oropharynx is clear and moist. No oropharyngeal exudate.  Eyes: Conjunctivae are normal. Right eye exhibits no discharge. Left eye exhibits no discharge. No scleral icterus.  Neck: Normal range of motion. Neck supple. No JVD present. No tracheal deviation present. No thyromegaly present.  Cardiovascular: Normal rate, regular rhythm, normal heart sounds and intact distal pulses.  Exam reveals no gallop and no friction rub.   No murmur heard. Pulmonary/Chest: Effort normal and breath sounds normal. No stridor. No respiratory distress. He has no wheezes. He has no rales. He exhibits no tenderness.  Abdominal: Soft. Bowel sounds are normal. He exhibits no distension and no mass. There is no tenderness. There is no rebound and no guarding. Hernia confirmed negative in the right inguinal area and confirmed negative in the left inguinal area.  Genitourinary: Rectum normal, prostate normal, testes normal and penis normal. Rectal exam shows no external hemorrhoid, no internal hemorrhoid, no fissure, no mass, no tenderness and anal tone normal. Guaiac negative stool. Prostate is not enlarged and not tender. Right testis shows no mass, no swelling and no tenderness. Right testis is descended. Left testis shows no mass, no swelling and no tenderness. Left testis is descended. Circumcised. No penile erythema or penile tenderness. No discharge found.  Musculoskeletal: Normal range of motion. He exhibits no edema and no  tenderness.  Lymphadenopathy:    He has no cervical adenopathy.       Right: No inguinal adenopathy present.       Left: No inguinal adenopathy present.  Neurological: He is alert and oriented to person, place, and time.  Skin: Skin is warm and dry. No rash noted. He is not diaphoretic. No erythema. No pallor.  Psychiatric: He has a normal mood and affect. His behavior is normal. Judgment and thought content normal.     Lab Results  Component Value Date   WBC 5.2 05/29/2012   HGB 13.5 05/29/2012   HCT 41.5 05/29/2012   PLT 263.0 05/29/2012   GLUCOSE 87 05/29/2012   CHOL 172 05/29/2012   TRIG 42.0 05/29/2012   HDL 78.70 05/29/2012   LDLCALC 85 05/29/2012   ALT 17 05/29/2012   AST 22 05/29/2012   NA 141 05/29/2012   K 4.0 05/29/2012   CL 103 05/29/2012   CREATININE 1.0 05/29/2012   BUN 10 05/29/2012   CO2 31 05/29/2012   TSH 1.82 05/29/2012   PSA 1.10 05/29/2012       Assessment & Plan:

## 2013-07-08 NOTE — Patient Instructions (Signed)
Health Maintenance, Males A healthy lifestyle and preventative care can promote health and wellness.  Maintain regular health, dental, and eye exams.  Eat a healthy diet. Foods like vegetables, fruits, whole grains, low-fat dairy products, and lean protein foods contain the nutrients you need without too many calories. Decrease your intake of foods high in solid fats, added sugars, and salt. Get information about a proper diet from your caregiver, if necessary.  Regular physical exercise is one of the most important things you can do for your health. Most adults should get at least 150 minutes of moderate-intensity exercise (any activity that increases your heart rate and causes you to sweat) each week. In addition, most adults need muscle-strengthening exercises on 2 or more days a week.   Maintain a healthy weight. The body mass index (BMI) is a screening tool to identify possible weight problems. It provides an estimate of body fat based on height and weight. Your caregiver can help determine your BMI, and can help you achieve or maintain a healthy weight. For adults 20 years and older:  A BMI below 18.5 is considered underweight.  A BMI of 18.5 to 24.9 is normal.  A BMI of 25 to 29.9 is considered overweight.  A BMI of 30 and above is considered obese.  Maintain normal blood lipids and cholesterol by exercising and minimizing your intake of saturated fat. Eat a balanced diet with plenty of fruits and vegetables. Blood tests for lipids and cholesterol should begin at age 20 and be repeated every 5 years. If your lipid or cholesterol levels are high, you are over 50, or you are a high risk for heart disease, you may need your cholesterol levels checked more frequently.Ongoing high lipid and cholesterol levels should be treated with medicines, if diet and exercise are not effective.  If you smoke, find out from your caregiver how to quit. If you do not use tobacco, do not start.  Lung  cancer screening is recommended for adults aged 55 80 years who are at high risk for developing lung cancer because of a history of smoking. Yearly low-dose computed tomography (CT) is recommended for people who have at least a 30-pack-year history of smoking and are a current smoker or have quit within the past 15 years. A pack year of smoking is smoking an average of 1 pack of cigarettes a day for 1 year (for example: 1 pack a day for 30 years or 2 packs a day for 15 years). Yearly screening should continue until the smoker has stopped smoking for at least 15 years. Yearly screening should also be stopped for people who develop a health problem that would prevent them from having lung cancer treatment.  If you choose to drink alcohol, do not exceed 2 drinks per day. One drink is considered to be 12 ounces (355 mL) of beer, 5 ounces (148 mL) of wine, or 1.5 ounces (44 mL) of liquor.  Avoid use of street drugs. Do not share needles with anyone. Ask for help if you need support or instructions about stopping the use of drugs.  High blood pressure causes heart disease and increases the risk of stroke. Blood pressure should be checked at least every 1 to 2 years. Ongoing high blood pressure should be treated with medicines if weight loss and exercise are not effective.  If you are 45 to 41 years old, ask your caregiver if you should take aspirin to prevent heart disease.  Diabetes screening involves taking a blood   sample to check your fasting blood sugar level. This should be done once every 3 years, after age 45, if you are within normal weight and without risk factors for diabetes. Testing should be considered at a younger age or be carried out more frequently if you are overweight and have at least 1 risk factor for diabetes.  Colorectal cancer can be detected and often prevented. Most routine colorectal cancer screening begins at the age of 50 and continues through age 75. However, your caregiver may  recommend screening at an earlier age if you have risk factors for colon cancer. On a yearly basis, your caregiver may provide home test kits to check for hidden blood in the stool. Use of a small camera at the end of a tube, to directly examine the colon (sigmoidoscopy or colonoscopy), can detect the earliest forms of colorectal cancer. Talk to your caregiver about this at age 50, when routine screening begins. Direct examination of the colon should be repeated every 5 to 10 years through age 75, unless early forms of pre-cancerous polyps or small growths are found.  Hepatitis C blood testing is recommended for all people born from 1945 through 1965 and any individual with known risks for hepatitis C.  Healthy men should no longer receive prostate-specific antigen (PSA) blood tests as part of routine cancer screening. Consult with your caregiver about prostate cancer screening.  Testicular cancer screening is not recommended for adolescents or adult males who have no symptoms. Screening includes self-exam, caregiver exam, and other screening tests. Consult with your caregiver about any symptoms you have or any concerns you have about testicular cancer.  Practice safe sex. Use condoms and avoid high-risk sexual practices to reduce the spread of sexually transmitted infections (STIs).  Use sunscreen. Apply sunscreen liberally and repeatedly throughout the day. You should seek shade when your shadow is shorter than you. Protect yourself by wearing long sleeves, pants, a wide-brimmed hat, and sunglasses year round, whenever you are outdoors.  Notify your caregiver of new moles or changes in moles, especially if there is a change in shape or color. Also notify your caregiver if a mole is larger than the size of a pencil eraser.  A one-time screening for abdominal aortic aneurysm (AAA) and surgical repair of large AAAs by sound wave imaging (ultrasonography) is recommended for ages 65 to 75 years who are  current or former smokers.  Stay current with your immunizations. Document Released: 02/04/2008 Document Revised: 12/03/2012 Document Reviewed: 01/03/2011 [employer] Patient Information 2014 [employer].  

## 2013-09-24 ENCOUNTER — Encounter: Payer: Self-pay | Admitting: Gastroenterology

## 2013-10-16 ENCOUNTER — Encounter: Payer: Self-pay | Admitting: Gastroenterology

## 2013-10-21 ENCOUNTER — Ambulatory Visit: Payer: Self-pay | Admitting: Gastroenterology

## 2013-11-25 ENCOUNTER — Ambulatory Visit (INDEPENDENT_AMBULATORY_CARE_PROVIDER_SITE_OTHER): Payer: BC Managed Care – PPO | Admitting: Gastroenterology

## 2013-11-25 ENCOUNTER — Encounter: Payer: Self-pay | Admitting: Gastroenterology

## 2013-11-25 VITALS — BP 140/80 | HR 66 | Ht 67.5 in | Wt 211.0 lb

## 2013-11-25 DIAGNOSIS — Z1211 Encounter for screening for malignant neoplasm of colon: Secondary | ICD-10-CM

## 2013-11-25 DIAGNOSIS — R1319 Other dysphagia: Secondary | ICD-10-CM

## 2013-11-25 DIAGNOSIS — K219 Gastro-esophageal reflux disease without esophagitis: Secondary | ICD-10-CM

## 2013-11-25 MED ORDER — OMEPRAZOLE 20 MG PO CPDR: 20.0000 mg | DELAYED_RELEASE_CAPSULE | Freq: Every day | ORAL | Status: DC

## 2013-11-25 NOTE — Progress Notes (Signed)
    History of Present Illness: This is a 43 year old male with a history of GERD and esophageal stricture. He had a food impaction and esophageal dilation in 2013. He has intermittent reflux symptoms related to certain foods stressors for many years. He notes intermittent solid food dysphagia for several months.  He is concerned about colon cancer screening. There is no family history of colon cancer or colon polyps. He has distant relatives with pancreatic cancer prostate cancer. Denies weight loss, abdominal pain, constipation, diarrhea, change in stool caliber, melena, hematochezia, nausea, vomiting, chest pain.  Review of Systems: Pertinent positive and negative review of systems were noted in the above HPI section. All other review of systems were otherwise negative.  Current Medications, Allergies, Past Medical History, Past Surgical History, Family History and Social History were reviewed in [facility] electronic medical record.  Physical Exam: General: Well developed , well nourished, no acute distress Head: Normocephalic and atraumatic Eyes:  sclerae anicteric, EOMI Ears: Normal auditory acuity Mouth: No deformity or lesions Neck: Supple, no masses or thyromegaly Lungs: Clear throughout to auscultation Heart: Regular rate and rhythm; no murmurs, rubs or bruits Abdomen: Soft, non tender and non distended. No masses, hepatosplenomegaly or hernias noted. Normal Bowel sounds Musculoskeletal: Symmetrical with no gross deformities  Skin: No lesions on visible extremities Pulses:  Normal pulses noted Extremities: No clubbing, cyanosis, edema or deformities noted Neurological: Alert oriented x 4, grossly nonfocal Cervical Nodes:  No significant cervical adenopathy Inguinal Nodes: No significant inguinal adenopathy Psychological:  Alert and cooperative. Normal mood and affect  Assessment and Recommendations:  1. GERD and intermittent dysphasia. Standard antireflux measures and  omeprazole 20 mg daily. If his dysphagia is not completely resolve after 6-8 weeks of PPI daily, he is advised to call back to schedule endoscopy with dilation.  2. Colorectal cancer screening, average risk. In colonoscopies at age 43. If he develops new lower GI symptoms or has first degree relatives who develop  precancerous colon polyps or colon cancer, consider earlier colonoscopy.

## 2013-11-25 NOTE — Patient Instructions (Signed)
We have sent the following medications to your pharmacy for you to pick up at your convenience: Omeprazole.   Patient advised to avoid spicy, acidic, citrus, chocolate, mints, fruit and fruit juices.  Limit the intake of caffeine, alcohol and Soda.  Don't exercise too soon after eating.  Don't lie down within 3-4 hours of eating.  Elevate the head of your bed.  Thank you for choosing me and [facility].  Malcolm T. Stark, Jr., MD., FACG    

## 2014-05-19 ENCOUNTER — Encounter (HOSPITAL_COMMUNITY): Payer: Self-pay | Admitting: Emergency Medicine

## 2014-05-19 ENCOUNTER — Emergency Department (HOSPITAL_COMMUNITY)
Admission: EM | Admit: 2014-05-19 | Discharge: 2014-05-19 | Disposition: A | Discharge status: Home / Self Care | Payer: BC Managed Care – PPO | Attending: Emergency Medicine | Admitting: Emergency Medicine

## 2014-05-19 ENCOUNTER — Emergency Department (HOSPITAL_COMMUNITY): Payer: BC Managed Care – PPO

## 2014-05-19 DIAGNOSIS — Z87891 Personal history of nicotine dependence: Secondary | ICD-10-CM | POA: Insufficient documentation

## 2014-05-19 DIAGNOSIS — Z8719 Personal history of other diseases of the digestive system: Secondary | ICD-10-CM | POA: Diagnosis not present

## 2014-05-19 DIAGNOSIS — R519 Headache, unspecified: Secondary | ICD-10-CM

## 2014-05-19 DIAGNOSIS — I10 Essential (primary) hypertension: Secondary | ICD-10-CM | POA: Diagnosis not present

## 2014-05-19 DIAGNOSIS — R52 Pain, unspecified: Secondary | ICD-10-CM | POA: Insufficient documentation

## 2014-05-19 DIAGNOSIS — J45909 Unspecified asthma, uncomplicated: Secondary | ICD-10-CM | POA: Insufficient documentation

## 2014-05-19 DIAGNOSIS — Z8669 Personal history of other diseases of the nervous system and sense organs: Secondary | ICD-10-CM | POA: Diagnosis not present

## 2014-05-19 DIAGNOSIS — R51 Headache: Secondary | ICD-10-CM | POA: Diagnosis not present

## 2014-05-19 DIAGNOSIS — M79609 Pain in unspecified limb: Secondary | ICD-10-CM | POA: Insufficient documentation

## 2014-05-19 MED ORDER — DIAZEPAM 5 MG PO TABS: 5.0000 mg | ORAL_TABLET | Freq: Three times a day (TID) | ORAL | Status: DC | PRN

## 2014-05-19 MED ORDER — METOCLOPRAMIDE HCL 10 MG PO TABS
10.0000 mg | ORAL_TABLET | Freq: Once | ORAL | Status: AC
  Administered 2014-05-19: 10 mg via ORAL
  Filled 2014-05-19: qty 1

## 2014-05-19 MED ORDER — DIPHENHYDRAMINE HCL 25 MG PO CAPS
25.0000 mg | ORAL_CAPSULE | Freq: Once | ORAL | Status: AC
  Administered 2014-05-19: 25 mg via ORAL
  Filled 2014-05-19: qty 1

## 2014-05-19 MED ORDER — DEXAMETHASONE 6 MG PO TABS
10.0000 mg | ORAL_TABLET | Freq: Once | ORAL | Status: AC
  Administered 2014-05-19: 10 mg via ORAL
  Filled 2014-05-19 (×2): qty 1

## 2014-05-19 NOTE — Discharge Instructions (Signed)
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
## 2014-05-26 ENCOUNTER — Ambulatory Visit (INDEPENDENT_AMBULATORY_CARE_PROVIDER_SITE_OTHER): Payer: BC Managed Care – PPO | Admitting: Internal Medicine

## 2014-05-26 ENCOUNTER — Encounter: Payer: Self-pay | Admitting: Internal Medicine

## 2014-05-26 ENCOUNTER — Ambulatory Visit (INDEPENDENT_AMBULATORY_CARE_PROVIDER_SITE_OTHER)
Admission: RE | Admit: 2014-05-26 | Discharge: 2014-05-26 | Disposition: A | Discharge status: Home / Self Care | Payer: BC Managed Care – PPO | Source: Ambulatory Visit | Attending: Internal Medicine | Admitting: Internal Medicine

## 2014-05-26 VITALS — BP 140/82 | HR 77 | Temp 98.2°F | Resp 16 | Ht 67.5 in | Wt 210.0 lb

## 2014-05-26 DIAGNOSIS — I871 Compression of vein: Secondary | ICD-10-CM

## 2014-05-26 DIAGNOSIS — R079 Chest pain, unspecified: Secondary | ICD-10-CM

## 2014-05-26 DIAGNOSIS — I771 Stricture of artery: Secondary | ICD-10-CM | POA: Insufficient documentation

## 2014-05-26 DIAGNOSIS — I10 Essential (primary) hypertension: Secondary | ICD-10-CM

## 2014-05-26 NOTE — Progress Notes (Signed)
Subjective:    Patient ID: Kyle Donovan, male    DOB: 1971-04-04, 43 y.o.   MRN: [id number]  Chest Pain  This is a recurrent problem. The current episode started more than 1 month ago. The onset quality is gradual. The problem occurs intermittently. The problem has been unchanged. The pain is present in the lateral region (right upper chest wall area). The pain is at a severity of 1/10. The pain is mild. The quality of the pain is described as sharp. The pain radiates to the right arm. Associated symptoms include claudication (right arm) and weakness (rt arm). Pertinent negatives include no abdominal pain, back pain, cough, diaphoresis, dizziness, exertional chest pressure, fever, headaches, hemoptysis, irregular heartbeat, leg pain, lower extremity edema, malaise/fatigue, nausea, near-syncope, numbness, orthopnea, palpitations, PND, shortness of breath, sputum production, syncope or vomiting.  Pertinent negatives for past medical history include no seizures.      Review of Systems  Constitutional: Negative for fever, chills, malaise/fatigue, diaphoresis, appetite change and fatigue.  HENT: Negative.   Eyes: Negative.   Respiratory: Negative.  Negative for cough, hemoptysis, sputum production and shortness of breath.   Cardiovascular: Positive for chest pain and claudication (right arm). Negative for palpitations, orthopnea, leg swelling, syncope, PND and near-syncope.  Gastrointestinal: Negative.  Negative for nausea, vomiting and abdominal pain.  Endocrine: Negative.   Genitourinary: Negative.   Musculoskeletal: Negative.  Negative for back pain, myalgias, neck pain and neck stiffness.  Skin: Negative.  Negative for rash.  Allergic/Immunologic: Negative.   Neurological: Positive for weakness (rt arm). Negative for dizziness, tremors (rt arm), seizures, syncope, speech difficulty, light-headedness, numbness and headaches.  Hematological: Negative.  Negative for adenopathy. Does not  bruise/bleed easily.  Psychiatric/Behavioral: Negative.        Objective:   Physical Exam  Vitals reviewed. Constitutional: He is oriented to person, place, and time. He appears well-developed and well-nourished. No distress.  HENT:  Head: Normocephalic and atraumatic.  Mouth/Throat: Oropharynx is clear and moist. No oropharyngeal exudate.  Eyes: Conjunctivae are normal. Right eye exhibits no discharge. Left eye exhibits no discharge. No scleral icterus.  Neck: Normal range of motion. Neck supple. No JVD present. No tracheal deviation present. No thyromegaly present.  Cardiovascular: Normal rate, regular rhythm, S1 normal, S2 normal, normal heart sounds and intact distal pulses.  Exam reveals no gallop and no friction rub.   No murmur heard. Pulses:      Carotid pulses are 2+ on the right side, and 2+ on the left side.      Radial pulses are 0 on the right side, and 2+ on the left side.       Femoral pulses are 2+ on the right side, and 2+ on the left side.      Popliteal pulses are 2+ on the right side, and 2+ on the left side.       Dorsalis pedis pulses are 2+ on the right side, and 2+ on the left side.       Posterior tibial pulses are 2+ on the right side, and 2+ on the left side.  Pulmonary/Chest: Effort normal and breath sounds normal. No stridor. No respiratory distress. He has no wheezes. He has no rales. He exhibits no tenderness.  Abdominal: Soft. Bowel sounds are normal. He exhibits no distension and no mass. There is no tenderness. There is no rebound and no guarding.  Musculoskeletal: Normal range of motion. He exhibits no edema and no tenderness.  Lymphadenopathy:  He has no cervical adenopathy.  Neurological: He is oriented to person, place, and time.  Skin: Skin is warm and dry. No rash noted. He is not diaphoretic. No erythema. No pallor.  Psychiatric: He has a normal mood and affect. His behavior is normal. Judgment and thought content normal.     Lab Results    Component Value Date   WBC 4.1* 07/08/2013   HGB 14.0 07/08/2013   HCT 42.0 07/08/2013   PLT 254.0 07/08/2013   GLUCOSE 101* 07/08/2013   CHOL 162 07/08/2013   TRIG 34.0 07/08/2013   HDL 76.80 07/08/2013   LDLCALC 78 07/08/2013   ALT 18 07/08/2013   AST 19 07/08/2013   NA 142 07/08/2013   K 4.5 07/08/2013   CL 105 07/08/2013   CREATININE 1.0 07/08/2013   BUN 7 07/08/2013   CO2 32 07/08/2013   TSH 1.27 07/08/2013   PSA 1.10 05/29/2012        Assessment & Plan:

## 2014-05-26 NOTE — Progress Notes (Signed)
Pre visit review using our clinic review tool, if applicable. No additional management support is needed unless otherwise documented below in the visit note. 

## 2014-05-26 NOTE — Patient Instructions (Signed)

## 2014-05-29 NOTE — Assessment & Plan Note (Signed)
His BP is well controlled 

## 2014-05-29 NOTE — Assessment & Plan Note (Signed)
Exam and CXR are normal I don't think this is ischemia Will work up the arterial flow in his RUE

## 2014-05-29 NOTE — Assessment & Plan Note (Signed)
I am concerned that his s/s may be related to arterial flow in his RUE Will get an arterial flow study done

## 2014-06-02 ENCOUNTER — Other Ambulatory Visit (HOSPITAL_COMMUNITY): Payer: Self-pay | Admitting: Cardiology

## 2014-06-02 DIAGNOSIS — M79601 Pain in right arm: Secondary | ICD-10-CM

## 2014-06-02 DIAGNOSIS — R531 Weakness: Secondary | ICD-10-CM

## 2014-06-02 DIAGNOSIS — R0989 Other specified symptoms and signs involving the circulatory and respiratory systems: Secondary | ICD-10-CM

## 2014-06-03 ENCOUNTER — Other Ambulatory Visit (HOSPITAL_COMMUNITY): Payer: Self-pay

## 2014-06-06 ENCOUNTER — Ambulatory Visit (HOSPITAL_COMMUNITY): Payer: BC Managed Care – PPO | Attending: Cardiovascular Disease | Admitting: *Deleted

## 2014-06-06 ENCOUNTER — Other Ambulatory Visit (HOSPITAL_COMMUNITY): Payer: Self-pay | Admitting: Cardiology

## 2014-06-06 DIAGNOSIS — M79601 Pain in right arm: Secondary | ICD-10-CM | POA: Diagnosis not present

## 2014-06-06 DIAGNOSIS — R0989 Other specified symptoms and signs involving the circulatory and respiratory systems: Secondary | ICD-10-CM | POA: Diagnosis not present

## 2014-06-06 DIAGNOSIS — R200 Anesthesia of skin: Secondary | ICD-10-CM | POA: Diagnosis not present

## 2014-06-06 DIAGNOSIS — R531 Weakness: Secondary | ICD-10-CM

## 2014-06-06 DIAGNOSIS — I10 Essential (primary) hypertension: Secondary | ICD-10-CM | POA: Insufficient documentation

## 2014-06-06 DIAGNOSIS — M79604 Pain in right leg: Secondary | ICD-10-CM

## 2014-06-06 DIAGNOSIS — I70209 Unspecified atherosclerosis of native arteries of extremities, unspecified extremity: Secondary | ICD-10-CM

## 2014-06-06 NOTE — Progress Notes (Signed)
Arterial Doppler Upper Performed

## 2014-06-06 NOTE — Progress Notes (Signed)
Arterial Duplex Upper Right Performed

## 2014-06-08 ENCOUNTER — Other Ambulatory Visit: Payer: Self-pay | Admitting: Internal Medicine

## 2014-06-08 DIAGNOSIS — I771 Stricture of artery: Secondary | ICD-10-CM

## 2014-06-11 ENCOUNTER — Encounter: Payer: Self-pay | Admitting: Vascular Surgery

## 2014-06-23 ENCOUNTER — Encounter: Payer: Self-pay | Admitting: Vascular Surgery

## 2014-06-24 ENCOUNTER — Ambulatory Visit (INDEPENDENT_AMBULATORY_CARE_PROVIDER_SITE_OTHER): Payer: BC Managed Care – PPO | Admitting: Vascular Surgery

## 2014-06-24 ENCOUNTER — Encounter: Payer: Self-pay | Admitting: Vascular Surgery

## 2014-06-24 VITALS — BP 139/96 | HR 73 | Resp 16 | Ht 67.0 in | Wt 210.0 lb

## 2014-06-24 DIAGNOSIS — I771 Stricture of artery: Secondary | ICD-10-CM

## 2014-06-24 DIAGNOSIS — I708 Atherosclerosis of other arteries: Secondary | ICD-10-CM
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
## 2014-08-01 ENCOUNTER — Emergency Department (HOSPITAL_COMMUNITY)
Admission: EM | Admit: 2014-08-01 | Discharge: 2014-08-01 | Disposition: A | Discharge status: Home / Self Care | Payer: BC Managed Care – PPO | Attending: Emergency Medicine | Admitting: Emergency Medicine

## 2014-08-01 ENCOUNTER — Encounter (HOSPITAL_COMMUNITY)
Admission: EM | Disposition: A | Discharge status: Home / Self Care | Payer: Self-pay | Source: Home / Self Care | Attending: Emergency Medicine

## 2014-08-01 ENCOUNTER — Encounter (HOSPITAL_COMMUNITY): Payer: Self-pay | Admitting: Emergency Medicine

## 2014-08-01 DIAGNOSIS — T18108A Unspecified foreign body in esophagus causing other injury, initial encounter: Secondary | ICD-10-CM

## 2014-08-01 DIAGNOSIS — K2100 Gastro-esophageal reflux disease with esophagitis, without bleeding: Secondary | ICD-10-CM

## 2014-08-01 DIAGNOSIS — R1310 Dysphagia, unspecified: Secondary | ICD-10-CM | POA: Diagnosis present

## 2014-08-01 DIAGNOSIS — K449 Diaphragmatic hernia without obstruction or gangrene: Secondary | ICD-10-CM | POA: Diagnosis not present

## 2014-08-01 DIAGNOSIS — I10 Essential (primary) hypertension: Secondary | ICD-10-CM | POA: Diagnosis not present

## 2014-08-01 DIAGNOSIS — J45909 Unspecified asthma, uncomplicated: Secondary | ICD-10-CM | POA: Diagnosis not present

## 2014-08-01 DIAGNOSIS — R1314 Dysphagia, pharyngoesophageal phase: Secondary | ICD-10-CM

## 2014-08-01 DIAGNOSIS — W44F3XA Food entering into or through a natural orifice, initial encounter: Secondary | ICD-10-CM

## 2014-08-01 DIAGNOSIS — K222 Esophageal obstruction: Secondary | ICD-10-CM | POA: Diagnosis not present

## 2014-08-01 DIAGNOSIS — Z87891 Personal history of nicotine dependence: Secondary | ICD-10-CM | POA: Insufficient documentation

## 2014-08-01 DIAGNOSIS — K219 Gastro-esophageal reflux disease without esophagitis: Secondary | ICD-10-CM

## 2014-08-01 DIAGNOSIS — Z8719 Personal history of other diseases of the digestive system: Secondary | ICD-10-CM

## 2014-08-01 DIAGNOSIS — T18128A Food in esophagus causing other injury, initial encounter: Secondary | ICD-10-CM

## 2014-08-01 HISTORY — PX: ESOPHAGOGASTRODUODENOSCOPY: SHX5428

## 2014-08-01 LAB — BASIC METABOLIC PANEL
Anion gap: 14 (ref 5–15)
BUN: 12 mg/dL (ref 6–23)
CO2: 28 mEq/L (ref 19–32)
Calcium: 10.1 mg/dL (ref 8.4–10.5)
Chloride: 103 mEq/L (ref 96–112)
Creatinine, Ser: 1.12 mg/dL (ref 0.50–1.35)
GFR calc Af Amer: >90 mL/min (ref >90)
GFR calc non Af Amer: 79 mL/min — ABNORMAL LOW (ref >90)
Glucose, Bld: 90 mg/dL (ref 70–99)
Potassium: 4.6 mEq/L (ref 3.7–5.3)
Sodium: 145 mEq/L (ref 137–147)

## 2014-08-01 LAB — CBC WITH DIFFERENTIAL/PLATELET
Basophils Absolute: 0 10*3/uL (ref 0.0–0.1)
Basophils Relative: 1 % (ref 0–1)
Eosinophils Absolute: 0.2 10*3/uL (ref 0.0–0.7)
Eosinophils Relative: 5 % (ref 0–5)
HCT: 41.6 % (ref 39.0–52.0)
Hemoglobin: 14 g/dL (ref 13.0–17.0)
Lymphocytes Relative: 44 % (ref 12–46)
Lymphs Abs: 2 10*3/uL (ref 0.7–4.0)
MCH: 32 pg (ref 26.0–34.0)
MCHC: 33.7 g/dL (ref 30.0–36.0)
MCV: 95 fL (ref 78.0–100.0)
Monocytes Absolute: 0.3 10*3/uL (ref 0.1–1.0)
Monocytes Relative: 7 % (ref 3–12)
Neutro Abs: 2 10*3/uL (ref 1.7–7.7)
Neutrophils Relative %: 43 % (ref 43–77)
Platelets: 274 10*3/uL (ref 150–400)
RBC: 4.38 MIL/uL (ref 4.22–5.81)
RDW: 12.1 % (ref 11.5–15.5)
WBC: 4.6 10*3/uL (ref 4.0–10.5)

## 2014-08-01 SURGERY — EGD (ESOPHAGOGASTRODUODENOSCOPY)
Anesthesia: Moderate Sedation

## 2014-08-01 MED ORDER — MIDAZOLAM HCL 10 MG/2ML IJ SOLN
INTRAMUSCULAR | Status: DC | PRN
  Administered 2014-08-01 (×2): 2 mg via INTRAVENOUS
  Administered 2014-08-01: 1 mg via INTRAVENOUS

## 2014-08-01 MED ORDER — GLUCAGON HCL RDNA (DIAGNOSTIC) 1 MG IJ SOLR
1.0000 mg | Freq: Once | INTRAMUSCULAR | Status: AC
  Administered 2014-08-01: 1 mg via INTRAVENOUS
  Filled 2014-08-01: qty 1

## 2014-08-01 MED ORDER — FENTANYL CITRATE 0.05 MG/ML IJ SOLN
INTRAMUSCULAR | Status: AC
  Filled 2014-08-01: qty 2

## 2014-08-01 MED ORDER — MIDAZOLAM HCL 10 MG/2ML IJ SOLN
INTRAMUSCULAR | Status: AC
  Filled 2014-08-01: qty 2

## 2014-08-01 MED ORDER — FENTANYL CITRATE 0.05 MG/ML IJ SOLN
INTRAMUSCULAR | Status: DC | PRN
  Administered 2014-08-01 (×2): 25 ug via INTRAVENOUS

## 2014-08-01 MED ORDER — ONDANSETRON HCL 4 MG/2ML IJ SOLN
4.0000 mg | Freq: Once | INTRAMUSCULAR | Status: AC
  Administered 2014-08-01: 4 mg via INTRAVENOUS
  Filled 2014-08-01: qty 2

## 2014-08-01 MED ORDER — DIPHENHYDRAMINE HCL 50 MG/ML IJ SOLN
INTRAMUSCULAR | Status: AC
  Filled 2014-08-01: qty 1

## 2014-08-01 MED ORDER — SODIUM CHLORIDE 0.9 % IV SOLN
INTRAVENOUS | Status: DC
  Administered 2014-08-01: 17:00:00 via INTRAVENOUS
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
## 2014-08-01 NOTE — Discharge Instructions (Signed)
Esophageal Stricture The esophagus is the long, narrow tube which carries food and liquid from the mouth to the stomach. Sometimes a part of the esophagus becomes narrow and makes it difficult, painful, or even impossible to swallow. This is called an esophageal stricture.  CAUSES  Common causes of blockage or strictures of the esophagus are:  Exposure of the lower esophagus to the acid from the stomach may cause narrowing.  Hiatal hernia in which a small part of the stomach bulges up through the diaphragm can cause a narrowing in the bottom of the esophagus.  Scleroderma is a tissue disorder that affects the esophagus and makes swallowing difficult.  Achalasia is an absence of nerves in the lower esophagus and to the esophageal sphincter. This absence of nerves may be congenital (present since birth). This can cause irregular spasms which do not allow food and fluid through.  Strictures may develop from swallowing materials which damage the esophagus. Examples are acids or alkalis such as lye.  Schatzki's Ring is a narrow ring of non-cancerous tissue which narrows the lower esophagus. The cause of this is unknown.  Growths can block the esophagus. SYMPTOMS  Some of the problems are difficulty swallowing or pain with swallowing. DIAGNOSIS  Your caregiver often suspects this problem by taking a medical history. They will also do a physical exam. They may then take X-rays and/or perform an endoscopy. Endoscopy is an exam in which a tube like a small flexible telescope is used to look at your esophagus.  TREATMENT  One form of treatment is to dilate the narrow area. This means to stretch it.  When this is not successful, chest surgery may be required. This is a much more extensive form of treatment with a longer recovery time. Both of the above treatments make the passage of food and water into the stomach easier. They also make it easier for stomach contents to bubble back into the  esophagus. Special medications may be used following the procedure to help prevent further narrowing. Medications may be used to lower the amount of acid in the stomach juice.  SEEK IMMEDIATE MEDICAL CARE IF:   Your swallowing is becoming more painful, difficult, or you are unable to swallow.  You vomit up blood.  You develop black tarry stools.  You develop chills.  You have a fever.  You develop chest or abdominal pain.  You develop shortness of breath, feel lightheaded, or faint. Follow up with medical care as your caregiver suggests. Document Released: 04/18/2006 Document Revised: 10/31/2011 Document Reviewed: 12/18/2013 [facility] Patient Information 2015 [employer], [place]. This information is not intended to replace advice given to you by your health care provider. Make sure you discuss any questions you have with your health care provider. Swallowed Foreign Body, Adult You have swallowed an object (foreign body). Once the foreign body has passed through the food tube (esophagus), which leads from the mouth to the stomach, it will usually continue through the body without problems. This is because the point where the esophagus enters into the stomach is the narrowest place through which the foreign body must pass. Sometimes the foreign body gets stuck. The most common type of foreign body obstruction in adults is food impaction. Many times, bones from fish or meat products may become lodged in the esophagus or injure the throat on the way down. When there is an object that obstructs the esophagus, the most obvious symptoms are pain and the inability to swallow normally. In some cases, foreign bodies  that can be life threatening are swallowed. Examples of these are certain medications and illicit drugs. Often in these instances, patients are afraid of telling what they swallowed. However, it is extremely important to tell the emergency caregiver what was swallowed because life-saving treatment  may be needed.  X-ray exams may be taken to find the location of the foreign body. However, some objects do not show up well or may be too small to be seen on an X-ray image. If the foreign body is too large or too sharp, it may be too dangerous to allow it to pass on its own. You may need to see a caregiver who specializes in the digestive system (gastroenterologist). In a few cases, a specialist may need to remove the object using a method called "endoscopy". This involves passing a thin, soft, flexible tube into the food pipe to locate and remove the object. Follow up with your primary doctor or the referral you were given by the emergency caregiver. HOME CARE INSTRUCTIONS   If your caregiver says it is safe for you to eat, then only have liquids and soft foods until your symptoms improve.  Once you are eating normally:  Cut food into small pieces.  Remove small bones from food.  Remove large seeds and pits from fruit.  Chew your food well.  Do not talk, laugh, or engage in physical activity while eating or swallowing. SEEK MEDICAL CARE IF:  You develop worsening shortness of breath, uncontrollable coughing, chest pains or high fever, greater than 102 F (38.9 C).  You are unable to eat or drink or you feel that food is getting stuck in your throat.  You have choking symptoms or cannot stop drooling.  You develop abdominal pain, vomiting (especially of blood), or rectal bleeding. MAKE SURE YOU:   Understand these instructions.  Will watch your condition.  Will get help right away if you are not doing well or get worse. Document Released: 01/26/2010 Document Revised: 10/31/2011 Document Reviewed: 01/26/2010 [facility] Patient Information 2015 [place], [place]. This information is not intended to replace advice given to you by your health care provider. Make sure you discuss any questions you have with your health care provider.

## 2014-08-01 NOTE — ED Notes (Addendum)
Pt reports he was eating hot dog and got part stuck in esophagus. Pt states when ever he tries to swallow, he cannot. Pt speaking full sentences, no difficulty breathing. Hx of esophageal strintures.

## 2014-08-01 NOTE — Op Note (Signed)
[facility] [address]  [place], [id number]   1ENDOSCOPY PROCEDURE REPORT  PATIENT: Kyle Donovan, Kyle Donovan  MR#: [id number] BIRTHDATE: Mar 08, 1971 , 43  yrs. old GENDER: male ENDOSCOPIST: Roxy CedarJohn N Perry Jr, MD REFERRED BY:  Emergency Room, M.D. PROCEDURE DATE:  08/01/2014 PROCEDURE:  EGD w/ foreign body removal   (meat impaction) ASA CLASS:     Class I INDICATIONS:  dysphagia.  /food impaction. Previous problem requiring endoscopy with dilation with Dr. Russella DarStark MEDICATIONS: Fentanyl 50 mcg IV and Versed 5.5 mg IV TOPICAL ANESTHETIC: Cetacaine Spray  DESCRIPTION OF PROCEDURE: After the risks benefits and alternatives of the procedure were thoroughly explained, informed consent was obtained.  The Pentax Gastroscope [id number] endoscope was introduced through the mouth and advanced to the second portion of the duodenum , Without limitations.  The instrument was slowly withdrawn as the mucosa was fully examined.    EXAM:The esophagus revealed a distal bolus of meat impacted.  This was gently advanced into the stomach.  There was an obvious ringlike peptic stricture at the gastroesophageal junction with minor trauma.  The stomach was normal except for hiatal hernia. The duodenum was normal.  Retroflexed views revealed a hiatal hernia.     The scope was then withdrawn from the patient and the procedure completed.  COMPLICATIONS: There were no immediate complications.  ENDOSCOPIC IMPRESSION: 1. Acute meat impaction of the esophagus status post endoscopic removal 2. GERD complicated by peptic stricturel  RECOMMENDATIONS: 1.  Clear liquids until 10 PM, then soft foods for 48 hours. Avoid meats until your GI follow-up. 2.  Please purchase Prilosec OTC 20 mg and take one daily until you're GI office follow-up 3.  Call Dr. Anselm JunglingStarks office for a follow-up appointment. You will require dilation or stretching of your esophagus to help avoid this problem in the future. The  number is [id number]  REPEAT EXAM:  eSigned:  Roxy CedarJohn N Perry Jr, MD 08/01/2014 6:25 PM    CC:The Patient and Claudette HeadMalcolm Stark, MD

## 2014-08-04 ENCOUNTER — Encounter (HOSPITAL_COMMUNITY): Payer: Self-pay | Admitting: Internal Medicine

## 2014-08-06 ENCOUNTER — Telehealth: Payer: Self-pay | Admitting: Gastroenterology

## 2014-08-06 NOTE — Telephone Encounter (Signed)
Patient is scheduled for follow up per procedure report on 08/22/15 with Dr. Russella DarStark

## 2014-08-11 SURGERY — EGD (ESOPHAGOGASTRODUODENOSCOPY)
Anesthesia: Moderate Sedation

## 2014-08-13 ENCOUNTER — Encounter: Payer: Self-pay | Admitting: *Deleted

## 2014-08-21 ENCOUNTER — Encounter: Payer: Self-pay | Admitting: Gastroenterology

## 2014-08-21 ENCOUNTER — Ambulatory Visit (INDEPENDENT_AMBULATORY_CARE_PROVIDER_SITE_OTHER): Payer: BC Managed Care – PPO | Admitting: Gastroenterology

## 2014-08-21 VITALS — BP 140/96 | HR 75 | Ht 67.5 in | Wt 210.6 lb

## 2014-08-21 DIAGNOSIS — K219 Gastro-esophageal reflux disease without esophagitis: Secondary | ICD-10-CM

## 2014-08-21 DIAGNOSIS — R1314 Dysphagia, pharyngoesophageal phase: Secondary | ICD-10-CM

## 2014-08-21 DIAGNOSIS — K222 Esophageal obstruction: Secondary | ICD-10-CM
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
## 2014-08-21 NOTE — Patient Instructions (Signed)
You have been scheduled for an endoscopy. Please follow written instructions given to you at your visit today. If you use inhalers (even only as needed), please bring them with you on the day of your procedure. Your physician has requested that you go to www.startemmi.com and enter the access code given to you at your visit today. This web site gives a general overview about your procedure. However, you should still follow specific instructions given to you by our office regarding your preparation for the procedure.  Thank you for choosing me and [facility].  Malcolm T. Stark, Jr., MD., FACG  

## 2014-09-02 ENCOUNTER — Ambulatory Visit (AMBULATORY_SURGERY_CENTER): Payer: BLUE CROSS/BLUE SHIELD | Admitting: Gastroenterology

## 2014-09-02 ENCOUNTER — Encounter: Payer: Self-pay | Admitting: Gastroenterology

## 2014-09-02 VITALS — BP 178/99 | HR 74 | Temp 96.5°F | Resp 18 | Ht 67.5 in | Wt 210.0 lb

## 2014-09-02 DIAGNOSIS — K219 Gastro-esophageal reflux disease without esophagitis: Secondary | ICD-10-CM

## 2014-09-02 DIAGNOSIS — R1310 Dysphagia, unspecified: Secondary | ICD-10-CM

## 2014-09-02 DIAGNOSIS — K222 Esophageal obstruction: Secondary | ICD-10-CM

## 2014-09-02 DIAGNOSIS — R1319 Other dysphagia: Secondary | ICD-10-CM

## 2014-09-02 DIAGNOSIS — R1314 Dysphagia, pharyngoesophageal phase: Secondary | ICD-10-CM

## 2014-09-02 MED ORDER — SODIUM CHLORIDE 0.9 % IV SOLN: 500.0000 mL | INTRAVENOUS | Status: DC

## 2014-09-02 NOTE — Progress Notes (Signed)
Procedure ends, to recovery, report given and VSS. 

## 2014-09-02 NOTE — Patient Instructions (Signed)

## 2014-09-02 NOTE — Progress Notes (Signed)
Called to room to assist during endoscopic procedure.  Patient ID and intended procedure confirmed with present staff. Received instructions for my participation in the procedure from the performing physician.  

## 2014-09-02 NOTE — Op Note (Signed)
[facility] [address] [place], [id number]   ENDOSCOPY PROCEDURE REPORT  PATIENT: Kyle PillowVanstory, Kyle J  MR#: [id number] BIRTHDATE: [birth year] , 43  yrs. old GENDER: male ENDOSCOPIST: Kyle DareMalcolm T Kerrion Kemppainen, MD, [facility] PROCEDURE DATE:  09/02/2014 PROCEDURE:  EGD w/ wire guided (savary) dilation ASA CLASS:     Class II INDICATIONS:  dysphagia and stricture dilation. MEDICATIONS: Monitored anesthesia care and Propofol 260 mg IV TOPICAL ANESTHETIC: none DESCRIPTION OF PROCEDURE: After the risks benefits and alternatives of the procedure were thoroughly explained, informed consent was obtained.  The LB [id number] endoscope was introduced through the mouth and advanced to the second portion of the duodenum , Without limitations.  The instrument was slowly withdrawn as the mucosa was fully examined.   ESOPHAGUS: There was a short benign appearing stricture, with an inner diameter of 12mm, at the gastroesophageal junction.  The stricture was easily traversable.  The stricture was dilated using a 14mm (42Fr) savary dilator over guidewire.  The stricture was dilated using a 15mm (45Fr) savary dilator over guidewire.  The stricture was dilated using a 16mm (48Fr) savary dilator over guidewire.  Mild resistance and no heme with all 3 dilators.  The esophagus was otherwise normal. STOMACH: Mild nonerosive gastritis  was found in the gastric antrum. The stomach otherwise appeared normal. DUODENUM: Mild duodenitis was found in the duodenal bulb. Retroflexed views revealed a small hiatal hernia.  The scope was then withdrawn from the patient and the procedure completed.  COMPLICATIONS: There were no immediate complications.  ENDOSCOPIC IMPRESSION: 1.   Short stricture at the gastroesophageal junction; dilated using savary dilators over guidewire 2.   Small hiatal hernia 3.   Mild nonerosive gastritis in the gastric antrum 4.   Mild nonerosive duodenitis in the duodenal  bulb  RECOMMENDATIONS: 1.  Anti-reflux regimen long term 2.  Continue PPI daily long term 3.  Follow clinical response to dilatation. 4.  Post dilation instructions 5.  Follow up office visit in 1 year  eSigned:  Meryl DareMalcolm T Karianne Nogueira, MD, [facility]AC[facility] 09/02/2014 11:54 AM

## 2014-09-03 ENCOUNTER — Telehealth: Payer: Self-pay | Admitting: *Deleted

## 2014-09-03 NOTE — Telephone Encounter (Signed)
  Follow up Call-  Call back number 09/02/2014  Post procedure Call Back phone  # [phone]  Permission to leave phone message Yes     Patient questions:  Do you have a fever, pain , or abdominal swelling? No. Pain Score  0 *  Have you tolerated food without any problems? Yes.    Have you been able to return to your normal activities? Yes.    Do you have any questions about your discharge instructions: Diet   Yes.   Medications  No. Follow up visit  No.  Do you have questions or concerns about your Care? No.  Actions: * If pain score is 4 or above: No action needed, pain <4. Pt. Wanted to know what he could eat today,informed pt. That he can go back on his normal diet,but make sure he take small bites and chew very well,he verbalize understanding.

## 2014-09-06 ENCOUNTER — Emergency Department (HOSPITAL_COMMUNITY)
Admission: EM | Admit: 2014-09-06 | Discharge: 2014-09-06 | Disposition: A | Discharge status: Home / Self Care | Payer: BLUE CROSS/BLUE SHIELD | Attending: Emergency Medicine | Admitting: Emergency Medicine

## 2014-09-06 ENCOUNTER — Encounter (HOSPITAL_COMMUNITY): Payer: Self-pay | Admitting: Emergency Medicine

## 2014-09-06 DIAGNOSIS — Z8719 Personal history of other diseases of the digestive system: Secondary | ICD-10-CM | POA: Diagnosis not present

## 2014-09-06 DIAGNOSIS — Z8669 Personal history of other diseases of the nervous system and sense organs: Secondary | ICD-10-CM | POA: Insufficient documentation

## 2014-09-06 DIAGNOSIS — Z79899 Other long term (current) drug therapy: Secondary | ICD-10-CM | POA: Diagnosis not present

## 2014-09-06 DIAGNOSIS — J069 Acute upper respiratory infection, unspecified: Secondary | ICD-10-CM | POA: Insufficient documentation

## 2014-09-06 DIAGNOSIS — R51 Headache: Secondary | ICD-10-CM | POA: Diagnosis present

## 2014-09-06 DIAGNOSIS — M791 Myalgia: Secondary | ICD-10-CM | POA: Diagnosis not present

## 2014-09-06 DIAGNOSIS — J45909 Unspecified asthma, uncomplicated: Secondary | ICD-10-CM | POA: Diagnosis not present

## 2014-09-06 DIAGNOSIS — I10 Essential (primary) hypertension: Secondary | ICD-10-CM | POA: Diagnosis not present

## 2014-09-06 DIAGNOSIS — Z87891 Personal history of nicotine dependence: Secondary | ICD-10-CM | POA: Diagnosis not present

## 2014-09-06 MED ORDER — AMOXICILLIN 500 MG PO CAPS: 500.0000 mg | ORAL_CAPSULE | Freq: Three times a day (TID) | ORAL | Status: DC

## 2014-09-06 MED ORDER — GUAIFENESIN-CODEINE 100-10 MG/5ML PO SOLN: 5.0000 mL | Freq: Three times a day (TID) | ORAL | Status: DC | PRN

## 2014-09-06 NOTE — Discharge Instructions (Signed)
° °Upper Respiratory Infection, Adult °An upper respiratory infection (URI) is also sometimes known as the common cold. The upper respiratory tract includes the nose, sinuses, throat, trachea, and bronchi. Bronchi are the airways leading to the lungs. Most people improve within 1 week, but symptoms can last up to 2 weeks. A residual cough may last even longer.  °CAUSES °Many different viruses can infect the tissues lining the upper respiratory tract. The tissues become irritated and inflamed and often become very moist. Mucus production is also common. A cold is contagious. You can easily spread the virus to others by oral contact. This includes kissing, sharing a glass, coughing, or sneezing. Touching your mouth or nose and then touching a surface, which is then touched by another person, can also spread the virus. °SYMPTOMS  °Symptoms typically develop 1 to 3 days after you come in contact with a cold virus. Symptoms vary from person to person. They may include: °· Runny nose. °· Sneezing. °· Nasal congestion. °· Sinus irritation. °· Sore throat. °· Loss of voice (laryngitis). °· Cough. °· Fatigue. °· Muscle aches. °· Loss of appetite. °· Headache. °· Low-grade fever. °DIAGNOSIS  °You might diagnose your own cold based on familiar symptoms, since most people get a cold 2 to 3 times a year. Your caregiver can confirm this based on your exam. Most importantly, your caregiver can check that your symptoms are not due to another disease such as strep throat, sinusitis, pneumonia, asthma, or epiglottitis. Blood tests, throat tests, and X-rays are not necessary to diagnose a common cold, but they may sometimes be helpful in excluding other more serious diseases. Your caregiver will decide if any further tests are required. °RISKS AND COMPLICATIONS  °You may be at risk for a more severe case of the common cold if you smoke cigarettes, have chronic heart disease (such as heart failure) or lung disease (such as asthma), or  if you have a weakened immune system. The very young and very old are also at risk for more serious infections. Bacterial sinusitis, middle ear infections, and bacterial pneumonia can complicate the common cold. The common cold can worsen asthma and chronic obstructive pulmonary disease (COPD). Sometimes, these complications can require emergency medical care and may be life-threatening. °PREVENTION  °The best way to protect against getting a cold is to practice good hygiene. Avoid oral or hand contact with people with cold symptoms. Wash your hands often if contact occurs. There is no clear evidence that vitamin C, vitamin E, echinacea, or exercise reduces the chance of developing a cold. However, it is always recommended to get plenty of rest and practice good nutrition. °TREATMENT  °Treatment is directed at relieving symptoms. There is no cure. Antibiotics are not effective, because the infection is caused by a virus, not by bacteria. Treatment may include: °· Increased fluid intake. Sports drinks offer valuable electrolytes, sugars, and fluids. °· Breathing heated mist or steam (vaporizer or shower). °· Eating chicken soup or other clear broths, and maintaining good nutrition. °· Getting plenty of rest. °· Using gargles or lozenges for comfort. °· Controlling fevers with ibuprofen or acetaminophen as directed by your caregiver. °· Increasing usage of your inhaler if you have asthma. °Zinc gel and zinc lozenges, taken in the first 24 hours of the common cold, can shorten the duration and lessen the severity of symptoms. Pain medicines may help with fever, muscle aches, and throat pain. A variety of non-prescription medicines are available to treat congestion and runny nose. Your   caregiver can make recommendations and may suggest nasal or lung inhalers for other symptoms.  °HOME CARE INSTRUCTIONS  °· Only take over-the-counter or prescription medicines for pain, discomfort, or fever as directed by your  caregiver. °· Use a warm mist humidifier or inhale steam from a shower to increase air moisture. This may keep secretions moist and make it easier to breathe. °· Drink enough water and fluids to keep your urine clear or pale yellow. °· Rest as needed. °· Return to work when your temperature has returned to normal or as your caregiver advises. You may need to stay home longer to avoid infecting others. You can also use a face mask and careful hand washing to prevent spread of the virus. °SEEK MEDICAL CARE IF:  °· After the first few days, you feel you are getting worse rather than better. °· You need your caregiver's advice about medicines to control symptoms. °· You develop chills, worsening shortness of breath, or brown or red sputum. These may be signs of pneumonia. °· You develop yellow or brown nasal discharge or pain in the face, especially when you bend forward. These may be signs of sinusitis. °· You develop a fever, swollen neck glands, pain with swallowing, or white areas in the back of your throat. These may be signs of strep throat. °SEEK IMMEDIATE MEDICAL CARE IF:  °· You have a fever. °· You develop severe or persistent headache, ear pain, sinus pain, or chest pain. °· You develop wheezing, a prolonged cough, cough up blood, or have a change in your usual mucus (if you have chronic lung disease). °· You develop sore muscles or a stiff neck. °Document Released: 02/01/2001 Document Revised: 10/31/2011 Document Reviewed: 11/13/2013 °[employer]® Patient Information ©2015 [employer]. This information is not intended to replace advice given to you by your health care provider. Make sure you discuss any questions you have with your health care provider. ° °General Headache Without Cause °A headache is pain or discomfort felt around the head or neck area. The specific cause of a headache may not be found. There are many causes and types of headaches. A few common ones are: °· Tension headaches. °· Migraine  headaches. °· Cluster headaches. °· Chronic daily headaches. °HOME CARE INSTRUCTIONS  °· Keep all follow-up appointments with your caregiver or any specialist referral. °· Only take over-the-counter or prescription medicines for pain or discomfort as directed by your caregiver. °· Lie down in a dark, quiet room when you have a headache. °· Keep a headache journal to find out what may trigger your migraine headaches. For example, write down: °¨ What you eat and drink. °¨ How much sleep you get. °¨ Any change to your diet or medicines. °· Try massage or other relaxation techniques. °· Put ice packs or heat on the head and neck. Use these 3 to 4 times per day for 15 to 20 minutes each time, or as needed. °· Limit stress. °· Sit up straight, and do not tense your muscles. °· Quit smoking if you smoke. °· Limit alcohol use. °· Decrease the amount of caffeine you drink, or stop drinking caffeine. °· Eat and sleep on a regular schedule. °· Get 7 to 9 hours of sleep, or as recommended by your caregiver. °· Keep lights dim if bright lights bother you and make your headaches worse. °SEEK MEDICAL CARE IF:  °· You have problems with the medicines you were prescribed. °· Your medicines are not working. °· You have a change   from the usual headache. °· You have nausea or vomiting. °SEEK IMMEDIATE MEDICAL CARE IF:  °· Your headache becomes severe. °· You have a fever. °· You have a stiff neck. °· You have loss of vision. °· You have muscular weakness or loss of muscle control. °· You start losing your balance or have trouble walking. °· You feel faint or pass out. °· You have severe symptoms that are different from your first symptoms. °MAKE SURE YOU:  °· Understand these instructions. °· Will watch your condition. °· Will get help right away if you are not doing well or get worse. °Document Released: 08/08/2005 Document Revised: 10/31/2011 Document Reviewed: 08/24/2011 °[employer]® Patient Information ©2015 [employer]. This  information is not intended to replace advice given to you by your health care provider. Make sure you discuss any questions you have with your health care provider. ° ° °
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
## 2014-09-06 NOTE — ED Notes (Signed)
Pt from home c/o of bodyache and headache since last week.

## 2014-11-25 ENCOUNTER — Other Ambulatory Visit (INDEPENDENT_AMBULATORY_CARE_PROVIDER_SITE_OTHER): Payer: BLUE CROSS/BLUE SHIELD

## 2014-11-25 ENCOUNTER — Encounter: Payer: Self-pay | Admitting: Internal Medicine

## 2014-11-25 ENCOUNTER — Ambulatory Visit (INDEPENDENT_AMBULATORY_CARE_PROVIDER_SITE_OTHER): Payer: BLUE CROSS/BLUE SHIELD | Admitting: Internal Medicine

## 2014-11-25 VITALS — BP 148/110 | HR 94 | Temp 98.3°F | Resp 16 | Ht 68.0 in | Wt 210.8 lb

## 2014-11-25 DIAGNOSIS — K2100 Gastro-esophageal reflux disease with esophagitis, without bleeding: Secondary | ICD-10-CM

## 2014-11-25 DIAGNOSIS — I10 Essential (primary) hypertension: Secondary | ICD-10-CM

## 2014-11-25 DIAGNOSIS — R1310 Dysphagia, unspecified: Secondary | ICD-10-CM

## 2014-11-25 DIAGNOSIS — R195 Other fecal abnormalities: Secondary | ICD-10-CM

## 2014-11-25 DIAGNOSIS — R109 Unspecified abdominal pain: Secondary | ICD-10-CM

## 2014-11-25 LAB — BASIC METABOLIC PANEL
BUN: 11 mg/dL (ref 6–23)
CO2: 32 mEq/L (ref 19–32)
Calcium: 9.4 mg/dL (ref 8.4–10.5)
Chloride: 103 mEq/L (ref 96–112)
Creatinine, Ser: 1.14 mg/dL (ref 0.40–1.50)
GFR: 90.19 mL/min (ref >60.00)
Glucose, Bld: 99 mg/dL (ref 70–99)
Potassium: 3.8 mEq/L (ref 3.5–5.1)
Sodium: 139 mEq/L (ref 135–145)

## 2014-11-25 LAB — CBC WITH DIFFERENTIAL/PLATELET
Basophils Absolute: 0 10*3/uL (ref 0.0–0.1)
Basophils Relative: 0.3 % (ref 0.0–3.0)
Eosinophils Absolute: 0.4 10*3/uL (ref 0.0–0.7)
Eosinophils Relative: 5.4 % — ABNORMAL HIGH (ref 0.0–5.0)
HCT: 41.8 % (ref 39.0–52.0)
Hemoglobin: 14.2 g/dL (ref 13.0–17.0)
Lymphocytes Relative: 29.5 % (ref 12.0–46.0)
Lymphs Abs: 2.2 10*3/uL (ref 0.7–4.0)
MCHC: 33.9 g/dL (ref 30.0–36.0)
MCV: 92.7 fl (ref 78.0–100.0)
Monocytes Absolute: 0.5 10*3/uL (ref 0.1–1.0)
Monocytes Relative: 6.4 % (ref 3.0–12.0)
Neutro Abs: 4.3 10*3/uL (ref 1.4–7.7)
Neutrophils Relative %: 58.4 % (ref 43.0–77.0)
Platelets: 272 10*3/uL (ref 150.0–400.0)
RBC: 4.51 Mil/uL (ref 4.22–5.81)
RDW: 12.5 % (ref 11.5–15.5)
WBC: 7.3 10*3/uL (ref 4.0–10.5)

## 2014-11-25 LAB — HEPATIC FUNCTION PANEL
ALT: 23 U/L (ref 0–53)
AST: 16 U/L (ref 0–37)
Albumin: 3.8 g/dL (ref 3.5–5.2)
Alkaline Phosphatase: 58 U/L (ref 39–117)
Bilirubin, Direct: 0.2 mg/dL (ref 0.0–0.3)
Total Bilirubin: 0.6 mg/dL (ref 0.2–1.2)
Total Protein: 7.7 g/dL (ref 6.0–8.3)

## 2014-11-25 LAB — URINALYSIS
Hgb urine dipstick: NEGATIVE
Ketones, ur: NEGATIVE
Leukocytes, UA: NEGATIVE
Nitrite: NEGATIVE
Specific Gravity, Urine: 1.025 (ref 1.000–1.030)
Total Protein, Urine: NEGATIVE
Urine Glucose: NEGATIVE
Urobilinogen, UA: 1 (ref 0.0–1.0)
pH: 6 (ref 5.0–8.0)

## 2014-11-25 MED ORDER — METOPROLOL TARTRATE 25 MG PO TABS: 25.0000 mg | ORAL_TABLET | Freq: Two times a day (BID) | ORAL | Status: DC

## 2014-11-25 MED ORDER — OMEPRAZOLE 20 MG PO CPDR: 20.0000 mg | DELAYED_RELEASE_CAPSULE | Freq: Every day | ORAL | Status: DC

## 2014-11-25 NOTE — Patient Instructions (Signed)
Reflux of gastric acid may be asymptomatic as this may occur mainly during sleep.The triggers for reflux  include stress; the "aspirin family" ; alcohol; peppermint; and caffeine (coffee, tea, cola, and chocolate). The aspirin family would include aspirin and the nonsteroidal agents such as ibuprofen &  Naproxen. Tylenol would not cause reflux. If having symptoms ; food & drink should be avoided for @ least 2 hours before going to bed.   Minimal Blood Pressure Goal= AVERAGE < 140/90;  Ideal is an AVERAGE < 135/85. This AVERAGE should be calculated from @ least 5-7 BP readings taken @ different times of day on different days of week. You should not respond to isolated BP readings , but rather the AVERAGE for that week .Please bring your  blood pressure cuff to office visits to verify that it is reliable.It  can also be checked against the blood pressure device at the pharmacy. Finger or wrist cuffs are not dependable; an arm cuff is.   Your next office appointment will be determined based upon review of your pending labs & xrays  Those instructions will be transmitted to you by mail for your records.  Critical results will be called.   Followup as needed for any active or acute issue. Please report any significant change in your symptoms.

## 2014-11-25 NOTE — Progress Notes (Signed)
   Subjective:    Patient ID: Kyle Donovan, male    DOB: 1970-11-07, [age] y.o.   MRN: [id number]  HPI Symptoms began 11/23/14 as right lateral thoracoabdominal wall pain in the midaxillary line. He states that this has been somewhat intermittent & aching/throbbing. He's had nausea and vomiting 3 occasions. He's also had loose-watery stool 5. Yesterday stools were  frankly melena by description ebven prior to his taking Pepto-Bismol.  He has a history of esophageal stricture for which he's had dilation in 2013 and 2015. He continues to have some occasional dysphagia with certain foods such as cheeseburgers if he eats too fast & does not drink fluids .  He smoked 1994-2015 up to 1.5 packs per day. He has an occasional beer. He takess Advil 1-2 times per week. He also has peppermint on occasion. He drinks tea on average every other day.  He has no other GI or genitourinary symptoms. He denies any active pulmonary symptoms as well.  Review of Systems  There is no significant cough, sputum production,hemoptysis, wheezing,or  paroxysmal nocturnal dyspnea. Unexplained weight loss,  significant dyspepsia,  rectal bleeding, or persistently small caliber stools are not present. Dysuria, pyuria, hematuria, frequency, nocturia or polyuria are denied.     Objective:   Physical Exam  Pertinent or positive findings include: He has a lower partial.  Tinea is present over the posterior scalp line An S4 is present.  Breath sounds are decreased. The pain is localized to the inferior thorax/upper posterior flank on exam.  The right upper extremity is limp and atrophic. Genitalia normal. Prostate is normal.  Stool is light brown and Hemoccult negative.  General appearance :adequately nourished; in no distress. Eyes: No conjunctival inflammation or scleral icterus is present. Oral exam:  Lips and gums are healthy appearing.There is no oropharyngeal erythema or exudate noted. Heart:  Normal rate and  regular rhythm. S1 and S2 normal without gallop, murmur, click, or rub. Lungs:Chest clear to auscultation; no wheezes, rhonchi,rales ,or rubs present.No increased work of breathing.  Abdomen: bowel sounds normal, soft and non-tender without masses, organomegaly or hernias noted.  No guarding or rebound. No flank tenderness to percussion. Vascular : all pulses equal ; no bruits present. Skin:Warm & dry.  Intact without suspicious lesions or rashes ; no tenting  Lymphatic: No lymphadenopathy is noted about the head, neck, axilla, or inguinal areas.  Neuro: Strength, tone  normal.       Assessment & Plan:  #1 right posterior thoracic/flank pain  #2 reported melena;neg FOB  #3 hypertension;risk discussed  #4 history of GERD with esophageal stricture 2  Plan: See orders and recommendations

## 2014-11-25 NOTE — Progress Notes (Signed)
Pre visit review using our clinic review tool, if applicable. No additional management support is needed unless otherwise documented below in the visit note. 

## 2015-03-24 ENCOUNTER — Other Ambulatory Visit: Payer: Self-pay | Admitting: Internal Medicine

## 2015-05-04 ENCOUNTER — Encounter: Payer: Self-pay | Admitting: Internal Medicine

## 2015-05-04 ENCOUNTER — Ambulatory Visit (INDEPENDENT_AMBULATORY_CARE_PROVIDER_SITE_OTHER): Payer: BLUE CROSS/BLUE SHIELD | Admitting: Internal Medicine

## 2015-05-04 VITALS — BP 150/94 | HR 66 | Temp 98.7°F | Resp 18 | Wt 207.0 lb

## 2015-05-04 DIAGNOSIS — I10 Essential (primary) hypertension: Secondary | ICD-10-CM | POA: Diagnosis not present

## 2015-05-04 MED ORDER — NEBIVOLOL HCL 5 MG PO TABS: 5.0000 mg | ORAL_TABLET | Freq: Every day | ORAL | Status: DC

## 2015-05-04 NOTE — Progress Notes (Signed)
Subjective:  Patient ID: Kyle Donovan, male    DOB: 01-16-1971  Age: 44 y.o. MRN: [id number]  CC: Hypertension   HPI Damond J Juba presents for a blood pressure check. He was taking metoprolol but felt like it caused drowsiness so he has since stopped taking it. He ran out of it over a month ago. His blood pressure at home has not been well controlled. He has had a few dull headaches over the last month.  Outpatient Prescriptions Prior to Visit  Medication Sig Dispense Refill  . omeprazole (PRILOSEC) 20 MG capsule TAKE ONE CAPSULE EVERY DAY 30 capsule 3  . metoprolol tartrate (LOPRESSOR) 25 MG tablet TAKE 1 TABLET TWICE A DAY 60 tablet 2   No facility-administered medications prior to visit.    ROS Review of Systems  Constitutional: Negative.  Negative for fever, chills, diaphoresis and fatigue.  Eyes: Negative.   Respiratory: Negative.  Negative for cough, choking, chest tightness, shortness of breath and stridor.   Cardiovascular: Negative for chest pain, palpitations and leg swelling.  Gastrointestinal: Negative.  Negative for nausea, vomiting, abdominal pain, diarrhea, constipation and blood in stool.  Genitourinary: Negative.   Musculoskeletal: Negative.   Skin: Negative.   Neurological: Positive for headaches. Negative for dizziness, syncope, speech difficulty, weakness, light-headedness and numbness.  Hematological: Negative.  Negative for adenopathy. Does not bruise/bleed easily.  Psychiatric/Behavioral: Negative.     Objective:  BP 150/94 mmHg  Pulse 66  Temp(Src) 98.7 F (37.1 C) (Oral)  Resp 18  Wt 207 lb (93.895 kg)  SpO2 98%  BP Readings from Last 3 Encounters:  05/04/15 150/94  11/25/14 148/110  09/06/14 152/103    Wt Readings from Last 3 Encounters:  05/04/15 207 lb (93.895 kg)  11/25/14 210 lb 12 oz (95.596 kg)  09/02/14 210 lb (95.255 kg)    Physical Exam  Constitutional: He is oriented to person, place, and time. No distress.    HENT:  Head: Normocephalic and atraumatic.  Mouth/Throat: Oropharynx is clear and moist. No oropharyngeal exudate.  Eyes: Conjunctivae are normal. Right eye exhibits no discharge. Left eye exhibits no discharge. No scleral icterus.  Neck: Normal range of motion. Neck supple. No JVD present. No tracheal deviation present. No thyromegaly present.  Cardiovascular: Normal rate, regular rhythm, normal heart sounds and intact distal pulses.  Exam reveals no gallop and no friction rub.   No murmur heard. Pulmonary/Chest: Effort normal and breath sounds normal. No stridor. No respiratory distress. He has no wheezes. He has no rales. He exhibits no tenderness.  Abdominal: Soft. Bowel sounds are normal. He exhibits no distension and no mass. There is no tenderness. There is no rebound and no guarding.  Musculoskeletal: Normal range of motion. He exhibits no edema or tenderness.  Lymphadenopathy:    He has no cervical adenopathy.  Neurological: He is oriented to person, place, and time.  Skin: Skin is warm and dry. No rash noted. He is not diaphoretic. No erythema. No pallor.    Lab Results  Component Value Date   WBC 7.3 11/25/2014   HGB 14.2 11/25/2014   HCT 41.8 11/25/2014   PLT 272.0 11/25/2014   GLUCOSE 99 11/25/2014   CHOL 162 07/08/2013   TRIG 34.0 07/08/2013   HDL 76.80 07/08/2013   LDLCALC 78 07/08/2013   ALT 23 11/25/2014   AST 16 11/25/2014   NA 139 11/25/2014   K 3.8 11/25/2014   CL 103 11/25/2014   CREATININE 1.14 11/25/2014   BUN 11  11/25/2014   CO2 32 11/25/2014   TSH 1.27 07/08/2013   PSA 1.10 05/29/2012    No results found.  Assessment & Plan:   Sivan was seen today for hypertension.  Diagnoses and all orders for this visit:  Essential hypertension, benign- his blood pressure is not well controlled and he had side effects from metoprolol. I've given him samples of bystolic and have asked him to take it consistently for the next 1-2 months. He will then return  for recheck on his blood pressure. -     nebivolol (BYSTOLIC) 5 MG tablet; Take 1 tablet (5 mg total) by mouth daily.  I have discontinued Mr. Koska metoprolol tartrate. I am also having him start on nebivolol. Additionally, I am having him maintain his omeprazole.  Meds ordered this encounter  Medications  . nebivolol (BYSTOLIC) 5 MG tablet    Sig: Take 1 tablet (5 mg total) by mouth daily.    Dispense:  30 tablet    Refill:  11     Follow-up: Return in about 2 months (around 07/04/2015).  Sanda Linger, MD

## 2015-05-04 NOTE — Progress Notes (Signed)
Pre visit review using our clinic review tool, if applicable. No additional management support is needed unless otherwise documented below in the visit note. 

## 2015-05-04 NOTE — Patient Instructions (Signed)

## 2015-06-01 ENCOUNTER — Encounter (HOSPITAL_COMMUNITY): Payer: Self-pay | Admitting: *Deleted

## 2015-06-01 ENCOUNTER — Emergency Department (HOSPITAL_COMMUNITY): Payer: BLUE CROSS/BLUE SHIELD

## 2015-06-01 ENCOUNTER — Emergency Department (HOSPITAL_COMMUNITY)
Admission: EM | Admit: 2015-06-01 | Discharge: 2015-06-02 | Disposition: A | Discharge status: Home / Self Care | Payer: BLUE CROSS/BLUE SHIELD | Attending: Emergency Medicine | Admitting: Emergency Medicine

## 2015-06-01 DIAGNOSIS — R0789 Other chest pain: Secondary | ICD-10-CM | POA: Diagnosis not present

## 2015-06-01 DIAGNOSIS — Z79899 Other long term (current) drug therapy: Secondary | ICD-10-CM | POA: Diagnosis not present

## 2015-06-01 DIAGNOSIS — Z87891 Personal history of nicotine dependence: Secondary | ICD-10-CM | POA: Insufficient documentation

## 2015-06-01 DIAGNOSIS — J45909 Unspecified asthma, uncomplicated: Secondary | ICD-10-CM | POA: Insufficient documentation

## 2015-06-01 DIAGNOSIS — R1310 Dysphagia, unspecified: Secondary | ICD-10-CM | POA: Diagnosis not present

## 2015-06-01 DIAGNOSIS — Z8719 Personal history of other diseases of the digestive system: Secondary | ICD-10-CM | POA: Diagnosis not present

## 2015-06-01 DIAGNOSIS — I10 Essential (primary) hypertension: Secondary | ICD-10-CM | POA: Insufficient documentation

## 2015-06-01 DIAGNOSIS — Z8669 Personal history of other diseases of the nervous system and sense organs: Secondary | ICD-10-CM | POA: Insufficient documentation

## 2015-06-01 MED ORDER — GLUCAGON HCL RDNA (DIAGNOSTIC) 1 MG IJ SOLR
1.0000 mg | Freq: Once | INTRAMUSCULAR | Status: AC
Start: 2015-06-01 — End: 2015-06-01
  Administered 2015-06-01: 1 mg via INTRAMUSCULAR
  Filled 2015-06-01: qty 1

## 2015-06-01 NOTE — Discharge Instructions (Signed)

## 2015-06-01 NOTE — ED Notes (Signed)
Patient got choked at 1545 on baked chicken. Patient states he is unsure if the food remains in his throat. Patient c/o pain 9/10 in his throat and chest. Patient states he coughed a lot when he got choked.

## 2015-06-01 NOTE — ED Notes (Signed)
Pt reports he was eating chicken and got some stuck in his throat around 1545, reports this happened in March and had to have piece of food removed at hospital. Pt reports he has vomited x6, described saliva as vomit. Pt able to speak in full sentences, 99% on room air. Reports some short of breath.
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
## 2015-06-08 ENCOUNTER — Encounter: Payer: Self-pay | Admitting: Internal Medicine

## 2015-06-08 ENCOUNTER — Ambulatory Visit (INDEPENDENT_AMBULATORY_CARE_PROVIDER_SITE_OTHER): Payer: BLUE CROSS/BLUE SHIELD | Admitting: Internal Medicine

## 2015-06-08 VITALS — BP 144/92 | HR 61 | Temp 98.1°F | Ht 68.0 in | Wt 208.2 lb

## 2015-06-08 DIAGNOSIS — I10 Essential (primary) hypertension: Secondary | ICD-10-CM

## 2015-06-08 DIAGNOSIS — Z8719 Personal history of other diseases of the digestive system: Secondary | ICD-10-CM | POA: Diagnosis not present

## 2015-06-08 DIAGNOSIS — R1310 Dysphagia, unspecified: Secondary | ICD-10-CM

## 2015-06-08 MED ORDER — OMEPRAZOLE 20 MG PO CPDR: 20.0000 mg | DELAYED_RELEASE_CAPSULE | Freq: Every day | ORAL | Status: DC

## 2015-06-08 MED ORDER — OMEPRAZOLE 20 MG PO CPDR: 20.0000 mg | DELAYED_RELEASE_CAPSULE | Freq: Two times a day (BID) | ORAL | Status: DC

## 2015-06-08 NOTE — Progress Notes (Signed)
Pre visit review using our clinic review tool, if applicable. No additional management support is needed unless otherwise documented below in the visit note. 

## 2015-06-08 NOTE — Progress Notes (Signed)
   Subjective:    Patient ID: Kyle Donovan, male    DOB: May 02, 1971, 44 y.o.   MRN: [id number]  HPI  He was seen in the emergency room 06/01/15 with dysphagia. It was recommended he follow-up with GI.  The episode occurred with chicken. He only been able to drink liquids after onset of dysphagia. Actually he's been on an mechanical, chopped diet since his dilation in January 2016.  He has a history of upper endoscopy in 10/08/11 and 08/01/14. He had dilation 09/02/14. Omeprazole was prescribed; he has not taken this for at least 2 months.  He takes aspirin on average 2 every other day for headaches. He has coffee occasionally. He has no other triggers for reflux such as excess nonsteroidals, peppermint, alcohol, smoking, other caffeine intake.  He has not taken his blood pressure pills today. He does not monitor his blood pressure at home.   Review of Systems Unexplained weight loss, abdominal pain, significant dyspepsia, melena, rectal bleeding, or persistently small caliber stools are denied.  Chest pain, palpitations, tachycardia, exertional dyspnea, paroxysmal nocturnal dyspnea, claudication or edema are absent.     Objective:   Physical Exam  Pertinent or positive findings include: He has a lower partial. 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 General appearance :adequately nourished; in no distress.  Eyes: No conjunctival inflammation or scleral icterus is present.  Oral exam:  Lips and gums are healthy appearing.There is no oropharyngeal erythema or exudate noted. Dental hygiene is good.  Heart:  Normal rate and regular rhythm. S1 and S2 normal without gallop, murmur, click, rub or other extra sounds    Lungs:Chest clear to auscultation; no wheezes, rhonchi,rales ,or rubs present.No increased work of breathing.   Abdomen: bowel sounds normal, soft and non-tender without masses, organomegaly or hernias noted.  No guarding or rebound.   Vascular : all pulses equal ; no  bruits present.  Skin:Warm & dry.  Intact without suspicious lesions or rashes ; no tenting   Lymphatic: No lymphadenopathy is noted about the head, neck, axilla.   Neuro: Strength, tone normal.      Assessment & Plan:  #1 GERD with esophagitis  #2 dysphagia  Plan: Antireflux measures  PPI every 12 hours  GI referral   #3 hypertension; control appears adequate. It was elevated this morning but he did not take pressure medicines.

## 2015-06-08 NOTE — Assessment & Plan Note (Signed)
PPI bid ; GI referral Antireflux symptoms interventions discussed

## 2015-06-08 NOTE — Patient Instructions (Addendum)
Reflux of gastric acid may be asymptomatic as this may occur mainly during sleep.The triggers for reflux  include stress; the "aspirin family" ; alcohol; peppermint; and caffeine (coffee, tea, cola, and chocolate). The aspirin family would include aspirin and the nonsteroidal agents such as ibuprofen &  Naproxen. Tylenol would not cause reflux. If having symptoms ; food & drink should be avoided for @ least 2 hours before going to bed.  Please take the protein pump inhibitor 30 minutes before breakfast and 30 minutes before the evening meal.   Minimal Blood Pressure Goal= AVERAGE < 140/90;  Ideal is an AVERAGE < 135/85. This AVERAGE should be calculated from @ least 5-7 BP readings taken @ different times of day on different days of week. You should not respond to isolated BP readings , but rather the AVERAGE for that week .Please bring your  blood pressure cuff to office visits to verify that it is reliable.It  can also be checked against the blood pressure device at the pharmacy. Finger or wrist cuffs are not dependable; an arm cuff is.

## 2015-07-13 ENCOUNTER — Encounter: Payer: Self-pay | Admitting: Gastroenterology

## 2015-07-13 ENCOUNTER — Other Ambulatory Visit: Payer: Self-pay

## 2015-07-13 ENCOUNTER — Ambulatory Visit (INDEPENDENT_AMBULATORY_CARE_PROVIDER_SITE_OTHER): Payer: BLUE CROSS/BLUE SHIELD | Admitting: Gastroenterology

## 2015-07-13 VITALS — BP 136/100 | HR 76 | Ht 67.5 in | Wt 215.2 lb

## 2015-07-13 DIAGNOSIS — R1314 Dysphagia, pharyngoesophageal phase: Secondary | ICD-10-CM

## 2015-07-13 DIAGNOSIS — R1310 Dysphagia, unspecified: Secondary | ICD-10-CM

## 2015-07-13 DIAGNOSIS — K219 Gastro-esophageal reflux disease without esophagitis: Secondary | ICD-10-CM

## 2015-07-13 DIAGNOSIS — R1319 Other dysphagia: Secondary | ICD-10-CM

## 2015-07-13 NOTE — Patient Instructions (Signed)
You have been scheduled for an endoscopy. Please follow written instructions given to you at your visit today. If you use inhalers (even only as needed), please bring them with you on the day of your procedure. Your physician has requested that you go to www.startemmi.com and enter the access code given to you at your visit today. This web site gives a general overview about your procedure. However, you should still follow specific instructions given to you by our office regarding your preparation for the procedure.  Thank you for choosing me and [facility].  Malcolm T. Stark, Jr., MD., FACG  

## 2015-07-13 NOTE — Progress Notes (Signed)
    History of Present Illness: This is a 44 year old male with chronic GERD, history of an esophageal stricture and recurrent solid food dysphagia. He underwent EGD with esophageal dilation in January 2016. He has frequent reflux symptoms and is taking omeprazole as needed instead daily as previously recommended. For the past several weeks he has noted frequent difficulty swallowing solids.  Current Medications, Allergies, Past Medical History, Past Surgical History, Family History and Social History were reviewed in [facility] electronic medical record.  Physical Exam: General: Well developed, well nourished, no acute distress Head: Normocephalic and atraumatic Eyes:  sclerae anicteric, EOMI Ears: Normal auditory acuity Mouth: No deformity or lesions Lungs: Clear throughout to auscultation Heart: Regular rate and rhythm; no murmurs, rubs or bruits Abdomen: Soft, non tender and non distended. No masses, hepatosplenomegaly or hernias noted. Normal Bowel sounds Musculoskeletal: Symmetrical with no gross deformities  Pulses:  Normal pulses noted Extremities: No clubbing, cyanosis, edema or deformities noted Neurological: Alert oriented x 4, grossly nonfocal Psychological:  Alert and cooperative. Normal mood and affect  Assessment and Recommendations:  1. GERD and recurrent solid food dysphasia. His reflux is not well controlled and he likely has a recurrent stricture. Standard antireflux measures and omeprazole 20 mg bid. Stressed compliance with omeprazole 20 mg twice daily (not prn) for now and we may decrease to once daily in the future. Schedule EGD with dilation. The risks (including bleeding, perforation, infection, missed lesions, medication reactions and possible hospitalization or surgery if complications occur), benefits, and alternatives to endoscopy with possible biopsy and possible dilation were discussed with the patient and they consent to proceed.   2. Colorectal cancer  screening, average risk. Colonoscopy at age 44. If he develops new lower GI symptoms or has a first degree relatives who develop precancerous colon polyps or colon cancer, consider earlier colonoscopy.

## 2015-07-28 ENCOUNTER — Encounter: Payer: Self-pay | Admitting: Gastroenterology

## 2015-08-04 ENCOUNTER — Ambulatory Visit (AMBULATORY_SURGERY_CENTER): Payer: BLUE CROSS/BLUE SHIELD | Admitting: Gastroenterology

## 2015-08-04 ENCOUNTER — Encounter: Payer: Self-pay | Admitting: Gastroenterology

## 2015-08-04 VITALS — BP 155/77 | HR 64 | Temp 96.8°F | Resp 19 | Ht 67.5 in | Wt 215.0 lb

## 2015-08-04 DIAGNOSIS — R1319 Other dysphagia: Secondary | ICD-10-CM

## 2015-08-04 DIAGNOSIS — R1310 Dysphagia, unspecified: Secondary | ICD-10-CM

## 2015-08-04 DIAGNOSIS — R1314 Dysphagia, pharyngoesophageal phase: Secondary | ICD-10-CM | POA: Diagnosis not present

## 2015-08-04 DIAGNOSIS — K222 Esophageal obstruction: Secondary | ICD-10-CM | POA: Diagnosis not present

## 2015-08-04 MED ORDER — SODIUM CHLORIDE 0.9 % IV SOLN: 500.0000 mL | INTRAVENOUS | Status: DC

## 2015-08-04 NOTE — Progress Notes (Signed)
Called to room to assist during endoscopic procedure.  Patient ID and intended procedure confirmed with present staff. Received instructions for my participation in the procedure from the performing physician.  

## 2015-08-04 NOTE — Patient Instructions (Signed)
YOU HAD AN ENDOSCOPIC PROCEDURE TODAY AT THE [facility]:   Refer to the procedure report that was given to you for any specific questions about what was found during the examination.  If the procedure report does not answer your questions, please call your gastroenterologist to clarify.  If you requested that your care partner not be given the details of your procedure findings, then the procedure report has been included in a sealed envelope for you to review at your convenience later.  YOU SHOULD EXPECT: Some feelings of bloating in the abdomen. Passage of more gas than usual.  Walking can help get rid of the air that was put into your GI tract during the procedure and reduce the bloating. If you had a lower endoscopy (such as a colonoscopy or flexible sigmoidoscopy) you may notice spotting of blood in your stool or on the toilet paper. If you underwent a bowel prep for your procedure, you may not have a normal bowel movement for a few days.  Please Note:  You might notice some irritation and congestion in your nose or some drainage.  This is from the oxygen used during your procedure.  There is no need for concern and it should clear up in a day or so.  SYMPTOMS TO REPORT IMMEDIATELY:     Following upper endoscopy (EGD)  Vomiting of blood or coffee ground material  New chest pain or pain under the shoulder blades  Painful or persistently difficult swallowing  New shortness of breath  Fever of 100F or higher  Black, tarry-looking stools  For urgent or emergent issues, a gastroenterologist can be reached at any hour by calling [phone].   DIET:  Follow Dilation Handout.  ACTIVITY:  You should plan to take it easy for the rest of today and you should NOT DRIVE or use heavy machinery until tomorrow (because of the sedation medicines used during the test).    FOLLOW UP: Our staff will call the number listed on your records the next business day following your procedure  to check on you and address any questions or concerns that you may have regarding the information given to you following your procedure. If we do not reach you, we will leave a message.  However, if you are feeling well and you are not experiencing any problems, there is no need to return our call.  We will assume that you have returned to your regular daily activities without incident.  If any biopsies were taken you will be contacted by phone or by letter within the next 1-3 weeks.  Please call us at [phone] if you have not heard about the biopsies in 3 weeks.    SIGNATURES/CONFIDENTIALITY: You and/or your care partner have signed paperwork which will be entered into your electronic medical record.  These signatures attest to the fact that that the information above on your After Visit Summary has been reviewed and is understood.  Full responsibility of the confidentiality of this discharge information lies with you and/or your care-partner.   Resume medications. Information given on Dilation diet and Hiatal Hernia.

## 2015-08-04 NOTE — Op Note (Signed)
[facility] [address] [place], [id number]   ENDOSCOPY PROCEDURE REPORT  PATIENT: Kyle Donovan, Kyle Donovan  MR#: [id number] BIRTHDATE: 1971-08-15 , 44  yrs. old GENDER: male ENDOSCOPIST: Meryl DareMalcolm T Rasheema Truluck, MD, [facility] PROCEDURE DATE:  08/04/2015 PROCEDURE:  EGD w/ wire guided (savary) dilation ASA CLASS:     Class II INDICATIONS:  dysphagia. MEDICATIONS: Monitored anesthesia care and Propofol 200 mg IV TOPICAL ANESTHETIC: none DESCRIPTION OF PROCEDURE: After the risks benefits and alternatives of the procedure were thoroughly explained, informed consent was obtained.  The LB [id number] endoscope was introduced through the mouth and advanced to the second portion of the duodenum , Without limitations.  The instrument was slowly withdrawn as the mucosa was fully examined.    ESOPHAGUS: There was a short benign appearing stricture, with an inner diameter of 13mm, at the gastroesophageal junction.  The stricture was easily traversable.  The stricture was dilated using 14mm, 15mm and 16mm (48Fr) savary dilators over guidewire.  The esophagus otherwise appeared normal.  Minimal resistance and no heme with all dilators. STOMACH: The mucosa of the stomach appeared normal. DUODENUM: The duodenal mucosa showed no abnormalities.  Retroflexed views revealed a small hiatal hernia.   The scope was then withdrawn from the patient and the procedure completed.  COMPLICATIONS: There were no immediate complications.  ENDOSCOPIC IMPRESSION: 1.   Stricture at the gastroesophageal junction; dilated using savary dilators over guidewire 2.   Small hiatal hernia  RECOMMENDATIONS: 1.  Anti-reflux regimen 2.  Post dilation instructions 3.  Continue PPI bid 4.  Schedule an office appointment 2 months  eSigned:  Meryl DareMalcolm T Clay Menser, MD, [facility] 08/04/2015 11:27 AM

## 2015-08-04 NOTE — Progress Notes (Signed)
Report to PACU, RN, vss, BBS= Clear.  

## 2015-08-05 ENCOUNTER — Telehealth: Payer: Self-pay

## 2015-08-05 NOTE — Telephone Encounter (Signed)
  Follow up Call-  Call back number 08/04/2015 09/02/2014  Post procedure Call Back phone  # [phone]  Permission to leave phone message Yes Yes     Patient questions:  Do you have a fever, pain , or abdominal swelling? No. Pain Score  0 *  Have you tolerated food without any problems? Yes.    Have you been able to return to your normal activities? Yes.    Do you have any questions about your discharge instructions: Diet   No. Medications  No. Follow up visit  No.  Do you have questions or concerns about your Care? No.  Actions: * If pain score is 4 or above: No action needed, pain <4.

## 2015-08-19 ENCOUNTER — Telehealth: Payer: Self-pay | Admitting: Gastroenterology

## 2015-08-19 NOTE — Telephone Encounter (Signed)
Patient notified that only had dilation no biopsy on EGD.  He will call back for any additional questions or concerns

## 2015-10-29 ENCOUNTER — Other Ambulatory Visit: Payer: Self-pay | Admitting: Internal Medicine

## 2015-11-09 ENCOUNTER — Other Ambulatory Visit (INDEPENDENT_AMBULATORY_CARE_PROVIDER_SITE_OTHER): Payer: BLUE CROSS/BLUE SHIELD

## 2015-11-09 ENCOUNTER — Encounter: Payer: Self-pay | Admitting: Internal Medicine

## 2015-11-09 ENCOUNTER — Ambulatory Visit (INDEPENDENT_AMBULATORY_CARE_PROVIDER_SITE_OTHER): Payer: BLUE CROSS/BLUE SHIELD | Admitting: Internal Medicine

## 2015-11-09 VITALS — BP 140/90 | HR 79 | Temp 98.0°F | Resp 16 | Ht 67.5 in | Wt 217.0 lb

## 2015-11-09 DIAGNOSIS — Z0000 Encounter for general adult medical examination without abnormal findings: Secondary | ICD-10-CM

## 2015-11-09 DIAGNOSIS — K2100 Gastro-esophageal reflux disease with esophagitis, without bleeding: Secondary | ICD-10-CM

## 2015-11-09 DIAGNOSIS — I10 Essential (primary) hypertension: Secondary | ICD-10-CM

## 2015-11-09 DIAGNOSIS — J45909 Unspecified asthma, uncomplicated: Secondary | ICD-10-CM | POA: Diagnosis not present

## 2015-11-09 LAB — CBC WITH DIFFERENTIAL/PLATELET
Basophils Absolute: 0 10*3/uL (ref 0.0–0.1)
Basophils Relative: 0.6 % (ref 0.0–3.0)
Eosinophils Absolute: 0.3 10*3/uL (ref 0.0–0.7)
Eosinophils Relative: 5.3 % — ABNORMAL HIGH (ref 0.0–5.0)
HCT: 39 % (ref 39.0–52.0)
Hemoglobin: 13.1 g/dL (ref 13.0–17.0)
Lymphocytes Relative: 40.1 % (ref 12.0–46.0)
Lymphs Abs: 1.9 10*3/uL (ref 0.7–4.0)
MCHC: 33.4 g/dL (ref 30.0–36.0)
MCV: 94.3 fl (ref 78.0–100.0)
Monocytes Absolute: 0.3 10*3/uL (ref 0.1–1.0)
Monocytes Relative: 7.3 % (ref 3.0–12.0)
Neutro Abs: 2.2 10*3/uL (ref 1.4–7.7)
Neutrophils Relative %: 46.7 % (ref 43.0–77.0)
Platelets: 257 10*3/uL (ref 150.0–400.0)
RBC: 4.14 Mil/uL — ABNORMAL LOW (ref 4.22–5.81)
RDW: 12.7 % (ref 11.5–15.5)
WBC: 4.7 10*3/uL (ref 4.0–10.5)

## 2015-11-09 LAB — COMPREHENSIVE METABOLIC PANEL
ALT: 29 U/L (ref 0–53)
AST: 22 U/L (ref 0–37)
Albumin: 4.4 g/dL (ref 3.5–5.2)
Alkaline Phosphatase: 40 U/L (ref 39–117)
BUN: 9 mg/dL (ref 6–23)
CO2: 28 mEq/L (ref 19–32)
Calcium: 9.6 mg/dL (ref 8.4–10.5)
Chloride: 106 mEq/L (ref 96–112)
Creatinine, Ser: 1.1 mg/dL (ref 0.40–1.50)
GFR: 93.56 mL/min (ref >60.00)
Glucose, Bld: 100 mg/dL — ABNORMAL HIGH (ref 70–99)
Potassium: 3.8 mEq/L (ref 3.5–5.1)
Sodium: 142 mEq/L (ref 135–145)
Total Bilirubin: 0.5 mg/dL (ref 0.2–1.2)
Total Protein: 7.3 g/dL (ref 6.0–8.3)

## 2015-11-09 LAB — LIPID PANEL
Cholesterol: 153 mg/dL (ref 0–200)
HDL: 61.4 mg/dL (ref >39.00)
LDL Cholesterol: 82 mg/dL (ref 0–99)
NonHDL: 91.64
Total CHOL/HDL Ratio: 2
Triglycerides: 48 mg/dL (ref 0.0–149.0)
VLDL: 9.6 mg/dL (ref 0.0–40.0)

## 2015-11-09 LAB — FECAL OCCULT BLOOD, GUAIAC: Fecal Occult Blood: NEGATIVE

## 2015-11-09 LAB — PSA: PSA: 1.02 ng/mL (ref 0.10–4.00)

## 2015-11-09 MED ORDER — ALBUTEROL SULFATE HFA 108 (90 BASE) MCG/ACT IN AERS: 2.0000 | INHALATION_SPRAY | Freq: Four times a day (QID) | RESPIRATORY_TRACT | Status: DC | PRN

## 2015-11-09 NOTE — Progress Notes (Signed)
Pre visit review using our clinic review tool, if applicable. No additional management support is needed unless otherwise documented below in the visit note. 

## 2015-11-09 NOTE — Assessment & Plan Note (Signed)
Improvement noted after EGD with balloon dilatation, he will continue the proton pump inhibitor.

## 2015-11-09 NOTE — Patient Instructions (Signed)

## 2015-11-09 NOTE — Progress Notes (Signed)
Subjective:  Patient ID: Kyle Donovan, male    DOB: 09-May-1971  Age: 45 y.o. MRN: [id number]  CC: Hypertension and Annual Exam   HPI Kyle Donovan presents for A complete physical.  He complains that when he gets a cold he has wheezing. The last time this happened was about 4 months ago. He likes to keep an albuterol inhaler and around to help with those symptoms. He has had no wheezing or respiratory symptoms for several months now.  He tells me that his blood pressure is well-controlled, he denies headache, blurred vision, chest pain, shortness of breath or edema. He is tolerating Bystolic well.  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 He has no heartburn, odynophagia, dysphagia, loss of appetite, or weight loss. He continues to take a proton pump inhibitor.  Outpatient Prescriptions Prior to Visit  Medication Sig Dispense Refill  . nebivolol (BYSTOLIC) 5 MG tablet Take 1 tablet (5 mg total) by mouth daily. 30 tablet 11  . omeprazole (PRILOSEC) 20 MG capsule TAKE ONE CAPSULE TWICE A DAY BEFORE A MEAL 30 capsule 0   No facility-administered medications prior to visit.    ROS Review of Systems  Constitutional: Negative.  Negative for fever, chills, diaphoresis, appetite change and fatigue.  HENT: Negative.  Negative for trouble swallowing and voice change.   Eyes: Negative.   Respiratory: Negative.  Negative for cough, choking, chest tightness, shortness of breath and stridor.   Cardiovascular: Negative.  Negative for chest pain, palpitations and leg swelling.  Gastrointestinal: Negative.  Negative for nausea, vomiting, abdominal pain, diarrhea, constipation and blood in stool.  Endocrine: Negative.   Genitourinary: Negative.  Negative for dysuria, urgency, frequency, hematuria, flank pain, penile swelling, difficulty urinating, penile pain and testicular pain.  Musculoskeletal: Negative.   Negative for myalgias, back pain, arthralgias and neck pain.  Skin: Negative.  Negative for color change and rash.  Allergic/Immunologic: Negative.   Neurological: Negative.  Negative for dizziness, tremors, light-headedness and numbness.  Hematological: Negative.  Negative for adenopathy. Does not bruise/bleed easily.  Psychiatric/Behavioral: Negative.     Objective:  BP 140/90 mmHg  Pulse 79  Temp(Src) 98 F (36.7 C) (Oral)  Resp 16  Ht 5' 7.5" (1.715 m)  Wt 217 lb (98.431 kg)  BMI 33.47 kg/m2  SpO2 96%  BP Readings from Last 3 Encounters:  11/09/15 140/90  08/04/15 155/77  07/13/15 136/100    Wt Readings from Last 3 Encounters:  11/09/15 217 lb (98.431 kg)  08/04/15 215 lb (97.523 kg)  07/13/15 215 lb 4 oz (97.637 kg)    Physical Exam  Constitutional: He is oriented to person, place, and time. No distress.  HENT:  Head: Normocephalic and atraumatic.  Mouth/Throat: Oropharynx is clear and moist. No oropharyngeal exudate.  Eyes: Conjunctivae are normal. Right eye exhibits no discharge. Left eye exhibits no discharge. No scleral icterus.  Neck: Normal range of motion. Neck supple. No JVD present. No tracheal deviation present. No thyromegaly present.  Cardiovascular: Normal rate, regular rhythm, normal heart sounds and intact distal pulses.  Exam reveals no gallop and no friction rub.   No murmur heard. Pulmonary/Chest: Effort normal and breath sounds normal. No stridor. No respiratory distress. He has no wheezes. He has no rales. He exhibits no tenderness.  Abdominal: Soft. Bowel sounds are normal. He exhibits no distension and no mass. There is no tenderness. There is no rebound and no  guarding. Hernia confirmed negative in the right inguinal area and confirmed negative in the left inguinal area.  Genitourinary: Rectum normal, testes normal and penis normal. Rectal exam shows no external hemorrhoid, no internal hemorrhoid, no fissure, no mass, no tenderness and anal tone  normal. Guaiac negative stool. Prostate is enlarged (1+ smooth symm BPH). Prostate is not tender. Right testis shows no mass, no swelling and no tenderness. Right testis is descended. Left testis shows no mass, no swelling and no tenderness. Left testis is descended. Circumcised. No penile erythema or penile tenderness. No discharge found.  Musculoskeletal: Normal range of motion. He exhibits no edema or tenderness.  Lymphadenopathy:    He has no cervical adenopathy.       Right: No inguinal adenopathy present.       Left: No inguinal adenopathy present.  Neurological: He is oriented to person, place, and time.  Skin: Skin is warm and dry. No rash noted. He is not diaphoretic. No erythema. No pallor.  Psychiatric: He has a normal mood and affect. His behavior is normal. Judgment and thought content normal.  Vitals reviewed.   Lab Results  Component Value Date   WBC 4.7 11/09/2015   HGB 13.1 11/09/2015   HCT 39.0 11/09/2015   PLT 257.0 11/09/2015   GLUCOSE 100* 11/09/2015   CHOL 153 11/09/2015   TRIG 48.0 11/09/2015   HDL 61.40 11/09/2015   LDLCALC 82 11/09/2015   ALT 29 11/09/2015   AST 22 11/09/2015   NA 142 11/09/2015   K 3.8 11/09/2015   CL 106 11/09/2015   CREATININE 1.10 11/09/2015   BUN 9 11/09/2015   CO2 28 11/09/2015   TSH 1.27 07/08/2013   PSA 1.02 11/09/2015    Dg Chest 2 View  06/01/2015  CLINICAL DATA:  Chest pain.  Sensation of food stuck in throat. EXAM: CHEST  2 VIEW COMPARISON:  05/26/2014 FINDINGS: The heart size and mediastinal contours are within normal limits. No evidence of pneumomediastinum or pneumothorax. Both lungs are clear. No evidence of pleural effusion. The visualized skeletal structures are unremarkable. Multiple surgical clips again seen in the right subclavian and axillary regions. No other radiopaque foreign body identified. IMPRESSION: No active cardiopulmonary disease. Electronically Signed   By: Kyle Donovan M.D.   On: 06/01/2015 16:55     Assessment & Plan:   Kyle Donovan was seen today for hypertension and annual exam.  Diagnoses and all orders for this visit:  Routine general medical examination at a health care facility- he refuses flu vaccine and pneumonia vaccine today, exam completed, labs ordered and reviewed, patient education material was given. -     PSA; Future -     Comprehensive metabolic panel; Future -     CBC with Differential/Platelet; Future -     Lipid panel; Future  Essential hypertension, benign- his blood pressures well controlled, electrolytes and renal function are stable.  Asthma, unspecified asthma severity, uncomplicated- he has symptoms so really that I do not recommend that he start an inhaled corticosteroid, he will continue using albuterol inhaler as needed. -     albuterol (PROVENTIL HFA;VENTOLIN HFA) 108 (90 Base) MCG/ACT inhaler; Inhale 2 puffs into the lungs every 6 (six) hours as needed for wheezing or shortness of breath.   I am having Mr. Humann start on albuterol. I am also having him maintain his nebivolol and omeprazole.  Meds ordered this encounter  Medications  . albuterol (PROVENTIL HFA;VENTOLIN HFA) 108 (90 Base) MCG/ACT inhaler    Sig:  Inhale 2 puffs into the lungs every 6 (six) hours as needed for wheezing or shortness of breath.    Dispense:  1 Inhaler    Refill:  3     Follow-up: Return in about 6 months (around 05/11/2016).  Sanda Linger, MD

## 2015-11-27 ENCOUNTER — Other Ambulatory Visit: Payer: Self-pay | Admitting: Internal Medicine

## 2016-02-17 ENCOUNTER — Other Ambulatory Visit: Payer: Self-pay | Admitting: Internal Medicine

## 2016-02-17 ENCOUNTER — Telehealth: Payer: Self-pay | Admitting: *Deleted

## 2016-02-17 DIAGNOSIS — J4520 Mild intermittent asthma, uncomplicated: Secondary | ICD-10-CM

## 2016-02-17 MED ORDER — ALBUTEROL SULFATE 108 (90 BASE) MCG/ACT IN AEPB: 1.0000 | INHALATION_SPRAY | Freq: Four times a day (QID) | RESPIRATORY_TRACT | Status: DC | PRN

## 2016-02-17 NOTE — Telephone Encounter (Signed)
changed

## 2016-02-17 NOTE — Telephone Encounter (Signed)
Left msg on triage stating needing to change pt from ProAir to the Proair resperclick due to cost. Is this ok...Raechel Chute/lmb

## 2016-04-04 ENCOUNTER — Encounter: Payer: Self-pay | Admitting: Internal Medicine

## 2016-04-04 ENCOUNTER — Other Ambulatory Visit (INDEPENDENT_AMBULATORY_CARE_PROVIDER_SITE_OTHER): Payer: BLUE CROSS/BLUE SHIELD

## 2016-04-04 ENCOUNTER — Ambulatory Visit (INDEPENDENT_AMBULATORY_CARE_PROVIDER_SITE_OTHER): Payer: BLUE CROSS/BLUE SHIELD | Admitting: Internal Medicine

## 2016-04-04 VITALS — BP 144/98 | HR 72 | Temp 98.4°F | Ht 67.5 in | Wt 210.5 lb

## 2016-04-04 DIAGNOSIS — I10 Essential (primary) hypertension: Secondary | ICD-10-CM

## 2016-04-04 LAB — BASIC METABOLIC PANEL
BUN: 9 mg/dL (ref 6–23)
CO2: 31 mEq/L (ref 19–32)
Calcium: 9.7 mg/dL (ref 8.4–10.5)
Chloride: 105 mEq/L (ref 96–112)
Creatinine, Ser: 1.1 mg/dL (ref 0.40–1.50)
GFR: 93.39 mL/min (ref >60.00)
Glucose, Bld: 96 mg/dL (ref 70–99)
Potassium: 3.8 mEq/L (ref 3.5–5.1)
Sodium: 142 mEq/L (ref 135–145)

## 2016-04-04 MED ORDER — ASPIRIN EC 81 MG PO TBEC: 81.0000 mg | DELAYED_RELEASE_TABLET | Freq: Every day | ORAL | 3 refills | Status: DC

## 2016-04-04 MED ORDER — CHLORTHALIDONE 25 MG PO TABS: 25.0000 mg | ORAL_TABLET | Freq: Every day | ORAL | 1 refills | Status: DC

## 2016-04-04 NOTE — Progress Notes (Signed)
Subjective:  Patient ID: Kyle Donovan, male    DOB: 1971/08/04  Age: 45 y.o. MRN: [id number]  CC: Hypertension   HPI Kyle Donovan presents for concerns that his blood pressure is not well controlled. Over the last 2 or 3 months he has had blood pressures at home of 146/95, 166/101, and 160/108. He is also had a few headaches. He denies blurred vision, chest pain, shortness of breath, edema, palpitations, abdominal pain, or near-syncope.  Outpatient Medications Prior to Visit  Medication Sig Dispense Refill  . Albuterol Sulfate (PROAIR RESPICLICK) 108 (90 Base) MCG/ACT AEPB Inhale 1 Act into the lungs 4 (four) times daily as needed. 1 each 11  . nebivolol (BYSTOLIC) 5 MG tablet Take 1 tablet (5 mg total) by mouth daily. 30 tablet 11  . omeprazole (PRILOSEC) 20 MG capsule TAKE ONE CAPSULE TWICE A DAY BEFORE A MEAL 90 capsule 1   No facility-administered medications prior to visit.     ROS Review of Systems  Constitutional: Negative.  Negative for activity change, appetite change, diaphoresis and fatigue.  Eyes: Negative.  Negative for visual disturbance.  Respiratory: Negative for apnea, cough, chest tightness, shortness of breath and stridor.   Cardiovascular: Negative.  Negative for chest pain, palpitations and leg swelling.  Gastrointestinal: Negative.  Negative for abdominal pain, constipation, diarrhea, nausea and vomiting.  Endocrine: Negative.   Genitourinary: Negative.   Musculoskeletal: Negative.  Negative for arthralgias, back pain, myalgias and neck pain.  Skin: Negative.   Allergic/Immunologic: Negative.   Neurological: Positive for headaches. Negative for dizziness, tremors, syncope, speech difficulty, weakness, light-headedness and numbness.  Hematological: Negative.  Negative for adenopathy. Does not bruise/bleed easily.  Psychiatric/Behavioral: Negative.     Objective:  BP (!) 144/98 (BP Location: Left Arm, Patient Position: Sitting, Cuff Size:  Normal)   Pulse 72   Temp 98.4 F (36.9 C) (Oral)   Ht 5' 7.5" (1.715 m)   Wt 210 lb 8 oz (95.5 kg)   SpO2 96%   BMI 32.48 kg/m   BP Readings from Last 3 Encounters:  04/04/16 (!) 144/98  11/09/15 140/90  08/04/15 (!) 155/77    Wt Readings from Last 3 Encounters:  04/04/16 210 lb 8 oz (95.5 kg)  11/09/15 217 lb (98.4 kg)  08/04/15 215 lb (97.5 kg)    Physical Exam  Constitutional: He is oriented to person, place, and time. No distress.  HENT:  Mouth/Throat: Oropharynx is clear and moist. No oropharyngeal exudate.  Eyes: Conjunctivae are normal. Right eye exhibits no discharge. Left eye exhibits no discharge. No scleral icterus.  Neck: Normal range of motion. Neck supple. No JVD present. No tracheal deviation present. No thyromegaly present.  Cardiovascular: Normal rate, regular rhythm, normal heart sounds and intact distal pulses.  Exam reveals no gallop and no friction rub.   No murmur heard. Pulmonary/Chest: Effort normal and breath sounds normal. No stridor. No respiratory distress. He has no wheezes. He has no rales. He exhibits no tenderness.  Abdominal: Soft. Bowel sounds are normal. He exhibits no distension and no mass. There is no tenderness. There is no rebound and no guarding.  Musculoskeletal: Normal range of motion. He exhibits no edema, tenderness or deformity.  Lymphadenopathy:    He has no cervical adenopathy.  Neurological: He is oriented to person, place, and time.  Skin: Skin is warm and dry. No rash noted. He is not diaphoretic. No erythema. No pallor.  Vitals reviewed.   Lab Results  Component Value Date  WBC 4.7 11/09/2015   HGB 13.1 11/09/2015   HCT 39.0 11/09/2015   PLT 257.0 11/09/2015   GLUCOSE 96 04/04/2016   CHOL 153 11/09/2015   TRIG 48.0 11/09/2015   HDL 61.40 11/09/2015   LDLCALC 82 11/09/2015   ALT 29 11/09/2015   AST 22 11/09/2015   NA 142 04/04/2016   K 3.8 04/04/2016   CL 105 04/04/2016   CREATININE 1.10 04/04/2016   BUN 9  04/04/2016   CO2 31 04/04/2016   TSH 1.27 07/08/2013   PSA 1.02 11/09/2015    Dg Chest 2 View  Result Date: 06/01/2015 CLINICAL DATA:  Chest pain.  Sensation of food stuck in throat. EXAM: CHEST  2 VIEW COMPARISON:  05/26/2014 FINDINGS: The heart size and mediastinal contours are within normal limits. No evidence of pneumomediastinum or pneumothorax. Both lungs are clear. No evidence of pleural effusion. The visualized skeletal structures are unremarkable. Multiple surgical clips again seen in the right subclavian and axillary regions. No other radiopaque foreign body identified. IMPRESSION: No active cardiopulmonary disease. Electronically Signed   By: Myles RosenthalJohn  Stahl M.D.   On: 06/01/2015 16:55    Assessment & Plan:   Tag was seen today for hypertension.  Diagnoses and all orders for this visit:  Essential hypertension, benign- his blood pressure is not adequately well controlled, his electrolytes and renal function are stable, will add a thiazide diuretic to the beta blocker therapy. -     chlorthalidone (HYGROTON) 25 MG tablet; Take 1 tablet (25 mg total) by mouth daily. -     Basic metabolic panel; Future  Other orders -     aspirin EC 81 MG tablet; Take 1 tablet (81 mg total) by mouth daily.   I am having Mr. Donovan start on chlorthalidone and aspirin EC. I am also having him maintain his nebivolol, omeprazole, and Albuterol Sulfate.  Meds ordered this encounter  Medications  . chlorthalidone (HYGROTON) 25 MG tablet    Sig: Take 1 tablet (25 mg total) by mouth daily.    Dispense:  90 tablet    Refill:  1  . aspirin EC 81 MG tablet    Sig: Take 1 tablet (81 mg total) by mouth daily.    Dispense:  90 tablet    Refill:  3     Follow-up: Return in about 2 months (around 06/04/2016).  Sanda Lingerhomas Jones, MD Dictation #1 [id number]  [id number]

## 2016-04-04 NOTE — Patient Instructions (Signed)
Hypertension Hypertension, commonly called high blood pressure, is when the force of blood pumping through your arteries is too strong. Your arteries are the blood vessels that carry blood from your heart throughout your body. A blood pressure reading consists of a higher number over a lower number, such as 110/72. The higher number (systolic) is the pressure inside your arteries when your heart pumps. The lower number (diastolic) is the pressure inside your arteries when your heart relaxes. Ideally you want your blood pressure below 120/80. Hypertension forces your heart to work harder to pump blood. Your arteries may become narrow or stiff. Having untreated or uncontrolled hypertension can cause heart attack, stroke, kidney disease, and other problems. RISK FACTORS Some risk factors for high blood pressure are controllable. Others are not.  Risk factors you cannot control include:   Race. You may be at higher risk if you are African American.  Age. Risk increases with age.  Gender. Men are at higher risk than women before age 45 years. After age 65, women are at higher risk than men. Risk factors you can control include:  Not getting enough exercise or physical activity.  Being overweight.  Getting too much fat, sugar, calories, or salt in your diet.  Drinking too much alcohol. SIGNS AND SYMPTOMS Hypertension does not usually cause signs or symptoms. Extremely high blood pressure (hypertensive crisis) may cause headache, anxiety, shortness of breath, and nosebleed. DIAGNOSIS To check if you have hypertension, your health care provider will measure your blood pressure while you are seated, with your arm held at the level of your heart. It should be measured at least twice using the same arm. Certain conditions can cause a difference in blood pressure between your right and left arms. A blood pressure reading that is higher than normal on one occasion does not mean that you need treatment. If  it is not clear whether you have high blood pressure, you may be asked to return on a different day to have your blood pressure checked again. Or, you may be asked to monitor your blood pressure at home for 1 or more weeks. TREATMENT Treating high blood pressure includes making lifestyle changes and possibly taking medicine. Living a healthy lifestyle can help lower high blood pressure. You may need to change some of your habits. Lifestyle changes may include:  Following the DASH diet. This diet is high in fruits, vegetables, and whole grains. It is low in salt, red meat, and added sugars.  Keep your sodium intake below 2,300 mg per day.  Getting at least 30-45 minutes of aerobic exercise at least 4 times per week.  Losing weight if necessary.  Not smoking.  Limiting alcoholic beverages.  Learning ways to reduce stress. Your health care provider may prescribe medicine if lifestyle changes are not enough to get your blood pressure under control, and if one of the following is true:  You are 18-59 years of age and your systolic blood pressure is above 140.  You are 60 years of age or older, and your systolic blood pressure is above 150.  Your diastolic blood pressure is above 90.  You have diabetes, and your systolic blood pressure is over 140 or your diastolic blood pressure is over 90.  You have kidney disease and your blood pressure is above 140/90.  You have heart disease and your blood pressure is above 140/90. Your personal target blood pressure may vary depending on your medical conditions, your age, and other factors. HOME CARE INSTRUCTIONS    Have your blood pressure rechecked as directed by your health care provider.   Take medicines only as directed by your health care provider. Follow the directions carefully. Blood pressure medicines must be taken as prescribed. The medicine does not work as well when you skip doses. Skipping doses also puts you at risk for  problems.  Do not smoke.   Monitor your blood pressure at home as directed by your health care provider. SEEK MEDICAL CARE IF:   You think you are having a reaction to medicines taken.  You have recurrent headaches or feel dizzy.  You have swelling in your ankles.  You have trouble with your vision. SEEK IMMEDIATE MEDICAL CARE IF:  You develop a severe headache or confusion.  You have unusual weakness, numbness, or feel faint.  You have severe chest or abdominal pain.  You vomit repeatedly.  You have trouble breathing. MAKE SURE YOU:   Understand these instructions.  Will watch your condition.  Will get help right away if you are not doing well or get worse.   This information is not intended to replace advice given to you by your health care provider. Make sure you discuss any questions you have with your health care provider.   Document Released: 08/08/2005 Document Revised: 12/23/2014 Document Reviewed: 05/31/2013 [employer] Interactive Patient Education 2016 [employer]  

## 2016-04-04 NOTE — Progress Notes (Signed)
Pre visit review using our clinic review tool, if applicable. No additional management support is needed unless otherwise documented below in the visit note. 

## 2016-09-15 ENCOUNTER — Other Ambulatory Visit: Payer: Self-pay | Admitting: Internal Medicine

## 2016-09-15 DIAGNOSIS — I10 Essential (primary) hypertension: Secondary | ICD-10-CM

## 2016-11-14 ENCOUNTER — Telehealth: Payer: Self-pay | Admitting: Internal Medicine

## 2016-11-16 ENCOUNTER — Other Ambulatory Visit: Payer: Self-pay | Admitting: Internal Medicine

## 2016-11-24 ENCOUNTER — Ambulatory Visit (INDEPENDENT_AMBULATORY_CARE_PROVIDER_SITE_OTHER): Payer: BLUE CROSS/BLUE SHIELD | Admitting: Internal Medicine

## 2016-11-24 ENCOUNTER — Encounter: Payer: Self-pay | Admitting: Internal Medicine

## 2016-11-24 ENCOUNTER — Other Ambulatory Visit (INDEPENDENT_AMBULATORY_CARE_PROVIDER_SITE_OTHER): Payer: BLUE CROSS/BLUE SHIELD

## 2016-11-24 VITALS — BP 138/88 | HR 69 | Temp 98.4°F | Resp 16 | Ht 67.5 in | Wt 225.0 lb

## 2016-11-24 DIAGNOSIS — I10 Essential (primary) hypertension: Secondary | ICD-10-CM | POA: Diagnosis not present

## 2016-11-24 DIAGNOSIS — Z0000 Encounter for general adult medical examination without abnormal findings: Secondary | ICD-10-CM

## 2016-11-24 DIAGNOSIS — K219 Gastro-esophageal reflux disease without esophagitis: Secondary | ICD-10-CM | POA: Diagnosis not present

## 2016-11-24 LAB — COMPREHENSIVE METABOLIC PANEL
ALT: 18 U/L (ref 0–53)
AST: 16 U/L (ref 0–37)
Albumin: 4.3 g/dL (ref 3.5–5.2)
Alkaline Phosphatase: 41 U/L (ref 39–117)
BUN: 12 mg/dL (ref 6–23)
CO2: 31 mEq/L (ref 19–32)
Calcium: 9.7 mg/dL (ref 8.4–10.5)
Chloride: 103 mEq/L (ref 96–112)
Creatinine, Ser: 1.09 mg/dL (ref 0.40–1.50)
GFR: 94.11 mL/min (ref >60.00)
Glucose, Bld: 88 mg/dL (ref 70–99)
Potassium: 3.9 mEq/L (ref 3.5–5.1)
Sodium: 141 mEq/L (ref 135–145)
Total Bilirubin: 0.5 mg/dL (ref 0.2–1.2)
Total Protein: 7.4 g/dL (ref 6.0–8.3)

## 2016-11-24 LAB — LIPID PANEL
Cholesterol: 154 mg/dL (ref 0–200)
HDL: 65.6 mg/dL (ref >39.00)
LDL Cholesterol: 78 mg/dL (ref 0–99)
NonHDL: 88.35
Total CHOL/HDL Ratio: 2
Triglycerides: 51 mg/dL (ref 0.0–149.0)
VLDL: 10.2 mg/dL (ref 0.0–40.0)

## 2016-11-24 LAB — CBC WITH DIFFERENTIAL/PLATELET
Basophils Absolute: 0 10*3/uL (ref 0.0–0.1)
Basophils Relative: 0.7 % (ref 0.0–3.0)
Eosinophils Absolute: 0.5 10*3/uL (ref 0.0–0.7)
Eosinophils Relative: 9 % — ABNORMAL HIGH (ref 0.0–5.0)
HCT: 39.8 % (ref 39.0–52.0)
Hemoglobin: 13.2 g/dL (ref 13.0–17.0)
Lymphocytes Relative: 47.9 % — ABNORMAL HIGH (ref 12.0–46.0)
Lymphs Abs: 2.5 10*3/uL (ref 0.7–4.0)
MCHC: 33.1 g/dL (ref 30.0–36.0)
MCV: 94.6 fl (ref 78.0–100.0)
Monocytes Absolute: 0.4 10*3/uL (ref 0.1–1.0)
Monocytes Relative: 7.1 % (ref 3.0–12.0)
Neutro Abs: 1.9 10*3/uL (ref 1.4–7.7)
Neutrophils Relative %: 35.3 % — ABNORMAL LOW (ref 43.0–77.0)
Platelets: 229 10*3/uL (ref 150.0–400.0)
RBC: 4.21 Mil/uL — ABNORMAL LOW (ref 4.22–5.81)
RDW: 12.1 % (ref 11.5–15.5)
WBC: 5.3 10*3/uL (ref 4.0–10.5)

## 2016-11-24 LAB — URINALYSIS, ROUTINE W REFLEX MICROSCOPIC
Bilirubin Urine: NEGATIVE
Hgb urine dipstick: NEGATIVE
Ketones, ur: NEGATIVE
Leukocytes, UA: NEGATIVE
Nitrite: NEGATIVE
RBC / HPF: NONE SEEN (ref 0–?)
Specific Gravity, Urine: 1.025 (ref 1.000–1.030)
Total Protein, Urine: NEGATIVE
Urine Glucose: NEGATIVE
Urobilinogen, UA: 0.2 (ref 0.0–1.0)
pH: 6 (ref 5.0–8.0)

## 2016-11-24 LAB — PSA: PSA: 0.81 ng/mL (ref 0.10–4.00)

## 2016-11-24 LAB — TSH: TSH: 1.36 u[IU]/mL (ref 0.35–4.50)

## 2016-11-24 MED ORDER — OMEPRAZOLE 20 MG PO CPDR: DELAYED_RELEASE_CAPSULE | ORAL | 3 refills | Status: DC

## 2016-11-24 MED ORDER — NEBIVOLOL HCL 5 MG PO TABS: 5.0000 mg | ORAL_TABLET | Freq: Every day | ORAL | 3 refills | Status: DC

## 2016-11-24 NOTE — Progress Notes (Signed)
Pre visit review using our clinic review tool, if applicable. No additional management support is needed unless otherwise documented below in the visit note. 

## 2016-11-24 NOTE — Progress Notes (Signed)
Subjective:  Patient ID: Kyle Donovan, male    DOB: 07/05/1971  Age: 46 y.o. MRN: [id number]  CC: Annual Exam; Hypertension; and Gastroesophageal Reflux   HPI Kyle Donovan presents for a CPX.   He feels well and offers no complaints. He has not been taking chlorthalidone and his BP has been well controlled with Bystolic.  Outpatient Medications Prior to Visit  Medication Sig Dispense Refill  . Albuterol Sulfate (PROAIR RESPICLICK) 108 (90 Base) MCG/ACT AEPB Inhale 1 Act into the lungs 4 (four) times daily as needed. 1 each 11  . aspirin EC 81 MG tablet Take 1 tablet (81 mg total) by mouth daily. 90 tablet 3  . BYSTOLIC 5 MG tablet TAKE 1 TABLET BY MOUTH DAILY 30 tablet 3  . chlorthalidone (HYGROTON) 25 MG tablet Take 1 tablet (25 mg total) by mouth daily. (Patient not taking: Reported on 11/24/2016) 90 tablet 1  . omeprazole (PRILOSEC) 20 MG capsule TAKE ONE CAPSULE TWICE A DAY BEFORE A MEAL (Patient not taking: Reported on 11/24/2016) 90 capsule 1   No facility-administered medications prior to visit.     ROS Review of Systems  Constitutional: Negative.  Negative for activity change, diaphoresis, fatigue and unexpected weight change.  HENT: Negative.   Eyes: Negative for visual disturbance.  Respiratory: Negative for cough, chest tightness, shortness of breath, wheezing and stridor.   Cardiovascular: Negative.  Negative for chest pain, palpitations and leg swelling.  Gastrointestinal: Negative for abdominal pain, constipation, diarrhea, nausea and vomiting.       He has rare heartburn  Endocrine: Negative.   Genitourinary: Negative.  Negative for difficulty urinating, discharge, penile pain, penile swelling, scrotal swelling, testicular pain and urgency.  Musculoskeletal: Negative.  Negative for back pain and myalgias.  Skin: Negative.   Allergic/Immunologic: Negative.   Neurological: Negative.  Negative for dizziness, syncope and weakness.  Hematological: Negative  for adenopathy. Does not bruise/bleed easily.  Psychiatric/Behavioral: Negative.     Objective:  BP 138/88 (BP Location: Right Arm, Patient Position: Sitting, Cuff Size: Large)   Pulse 69   Temp 98.4 F (36.9 C) (Oral)   Resp 16   Ht 5' 7.5" (1.715 m)   Wt 225 lb 0.6 oz (102.1 kg)   SpO2 96%   BMI 34.73 kg/m   BP Readings from Last 3 Encounters:  11/24/16 138/88  04/04/16 (!) 144/98  11/09/15 140/90    Wt Readings from Last 3 Encounters:  11/24/16 225 lb 0.6 oz (102.1 kg)  04/04/16 210 lb 8 oz (95.5 kg)  11/09/15 217 lb (98.4 kg)    Physical Exam  Constitutional: He is oriented to person, place, and time. No distress.  HENT:  Mouth/Throat: Oropharynx is clear and moist. No oropharyngeal exudate.  Eyes: Conjunctivae are normal. Right eye exhibits no discharge. Left eye exhibits no discharge. No scleral icterus.  Neck: Normal range of motion. Neck supple. No JVD present. No tracheal deviation present. No thyromegaly present.  Cardiovascular: Normal rate, regular rhythm, normal heart sounds and intact distal pulses.  Exam reveals no gallop and no friction rub.   No murmur heard. Pulmonary/Chest: Effort normal and breath sounds normal. No stridor. No respiratory distress. He has no wheezes. He has no rales. He exhibits no tenderness.  Abdominal: Soft. Bowel sounds are normal. He exhibits no distension and no mass. There is no tenderness. There is no rebound and no guarding. Hernia confirmed negative in the right inguinal area and confirmed negative in the left inguinal area.  Genitourinary: Rectum normal, prostate normal, testes normal and penis normal. Rectal exam shows no external hemorrhoid, no internal hemorrhoid, no fissure, no mass, no tenderness, anal tone normal and guaiac negative stool. Prostate is not enlarged and not tender. Right testis shows no mass, no swelling and no tenderness. Right testis is descended. Left testis shows no mass, no swelling and no tenderness. Left  testis is descended. Circumcised. No penile erythema or penile tenderness. No discharge found.  Musculoskeletal: Normal range of motion. He exhibits no edema, tenderness or deformity.  Lymphadenopathy:    He has no cervical adenopathy.       Right: No inguinal adenopathy present.       Left: No inguinal adenopathy present.  Neurological: He is oriented to person, place, and time.  Skin: Skin is warm and dry. No rash noted. He is not diaphoretic. No erythema. No pallor.  Psychiatric: He has a normal mood and affect. His behavior is normal. Judgment and thought content normal.  Vitals reviewed.   Lab Results  Component Value Date   WBC 5.3 11/24/2016   HGB 13.2 11/24/2016   HCT 39.8 11/24/2016   PLT 229.0 11/24/2016   GLUCOSE 88 11/24/2016   CHOL 154 11/24/2016   TRIG 51.0 11/24/2016   HDL 65.60 11/24/2016   LDLCALC 78 11/24/2016   ALT 18 11/24/2016   AST 16 11/24/2016   NA 141 11/24/2016   K 3.9 11/24/2016   CL 103 11/24/2016   CREATININE 1.09 11/24/2016   BUN 12 11/24/2016   CO2 31 11/24/2016   TSH 1.36 11/24/2016   PSA 0.81 11/24/2016    Dg Chest 2 View  Result Date: 06/01/2015 CLINICAL DATA:  Chest pain.  Sensation of food stuck in throat. EXAM: CHEST  2 VIEW COMPARISON:  05/26/2014 FINDINGS: The heart size and mediastinal contours are within normal limits. No evidence of pneumomediastinum or pneumothorax. Both lungs are clear. No evidence of pleural effusion. The visualized skeletal structures are unremarkable. Multiple surgical clips again seen in the right subclavian and axillary regions. No other radiopaque foreign body identified. IMPRESSION: No active cardiopulmonary disease. Electronically Signed   By: Myles Rosenthal M.D.   On: 06/01/2015 16:55    Assessment & Plan:   Riki was seen today for annual exam, hypertension and gastroesophageal reflux.  Diagnoses and all orders for this visit:  Essential hypertension, benign- His blood pressure is well-controlled,  will continue Bystolic at the current dose -     nebivolol (BYSTOLIC) 5 MG tablet; Take 1 tablet (5 mg total) by mouth daily.  Routine general medical examination at a health care facility- exam completed, he refused a flu vaccine, labs reviewed, patient education material was given. -     Lipid panel; Future -     Comprehensive metabolic panel; Future -     CBC with Differential/Platelet; Future -     PSA; Future -     TSH; Future -     Urinalysis, Routine w reflex microscopic; Future  Gastroesophageal reflux disease without esophagitis- will continue the PPI at the current dose. -     omeprazole (PRILOSEC) 20 MG capsule; TAKE ONE CAPSULE TWICE A DAY BEFORE A MEAL   I have discontinued Mr. Warshaw chlorthalidone. I have also changed his BYSTOLIC to nebivolol. Additionally, I am having him maintain his Albuterol Sulfate, aspirin EC, and omeprazole.  Meds ordered this encounter  Medications  . omeprazole (PRILOSEC) 20 MG capsule    Sig: TAKE ONE CAPSULE TWICE A DAY BEFORE  A MEAL    Dispense:  180 capsule    Refill:  3  . nebivolol (BYSTOLIC) 5 MG tablet    Sig: Take 1 tablet (5 mg total) by mouth daily.    Dispense:  90 tablet    Refill:  3     Follow-up: Return in about 6 months (around 05/26/2017).  Sanda Linger, MD

## 2016-11-24 NOTE — Patient Instructions (Signed)
 Health Maintenance, Male A healthy lifestyle and preventive care is important for your health and wellness. Ask your health care provider about what schedule of regular examinations is right for you. What should I know about weight and diet?  Eat a Healthy Diet  Eat plenty of vegetables, fruits, whole grains, low-fat dairy products, and lean protein.  Do not eat a lot of foods high in solid fats, added sugars, or salt. Maintain a Healthy Weight  Regular exercise can help you achieve or maintain a healthy weight. You should:  Do at least 150 minutes of exercise each week. The exercise should increase your heart rate and make you sweat (moderate-intensity exercise).  Do strength-training exercises at least twice a week. Watch Your Levels of Cholesterol and Blood Lipids  Have your blood tested for lipids and cholesterol every 5 years starting at 45 years of age. If you are at high risk for heart disease, you should start having your blood tested when you are 45 years old. You may need to have your cholesterol levels checked more often if:  Your lipid or cholesterol levels are high.  You are older than 45 years of age.  You are at high risk for heart disease. What should I know about cancer screening? Many types of cancers can be detected early and may often be prevented. Lung Cancer  You should be screened every year for lung cancer if:  You are a current smoker who has smoked for at least 30 years.  You are a former smoker who has quit within the past 15 years.  Talk to your health care provider about your screening options, when you should start screening, and how often you should be screened. Colorectal Cancer  Routine colorectal cancer screening usually begins at 45 years of age and should be repeated every 5-10 years until you are 45 years old. You may need to be screened more often if early forms of precancerous polyps or small growths are found. Your health care provider  may recommend screening at an earlier age if you have risk factors for colon cancer.  Your health care provider may recommend using home test kits to check for hidden blood in the stool.  A small camera at the end of a tube can be used to examine your colon (sigmoidoscopy or colonoscopy). This checks for the earliest forms of colorectal cancer. Prostate and Testicular Cancer  Depending on your age and overall health, your health care provider may do certain tests to screen for prostate and testicular cancer.  Talk to your health care provider about any symptoms or concerns you have about testicular or prostate cancer. Skin Cancer  Check your skin from head to toe regularly.  Tell your health care provider about any new moles or changes in moles, especially if:  There is a change in a mole's size, shape, or color.  You have a mole that is larger than a pencil eraser.  Always use sunscreen. Apply sunscreen liberally and repeat throughout the day.  Protect yourself by wearing long sleeves, pants, a wide-brimmed hat, and sunglasses when outside. What should I know about heart disease, diabetes, and high blood pressure?  If you are 18-39 years of age, have your blood pressure checked every 3-5 years. If you are 40 years of age or older, have your blood pressure checked every year. You should have your blood pressure measured twice-once when you are at a hospital or clinic, and once when you are not at   a hospital or clinic. Record the average of the two measurements. To check your blood pressure when you are not at a hospital or clinic, you can use:  An automated blood pressure machine at a pharmacy.  A home blood pressure monitor.  Talk to your health care provider about your target blood pressure.  If you are between 45-79 years old, ask your health care provider if you should take aspirin to prevent heart disease.  Have regular diabetes screenings by checking your fasting blood sugar  level.  If you are at a normal weight and have a low risk for diabetes, have this test once every three years after the age of 45.  If you are overweight and have a high risk for diabetes, consider being tested at a younger age or more often.  A one-time screening for abdominal aortic aneurysm (AAA) by ultrasound is recommended for men aged 65-75 years who are current or former smokers. What should I know about preventing infection? Hepatitis B  If you have a higher risk for hepatitis B, you should be screened for this virus. Talk with your health care provider to find out if you are at risk for hepatitis B infection. Hepatitis C  Blood testing is recommended for:  Everyone born from 1945 through 1965.  Anyone with known risk factors for hepatitis C. Sexually Transmitted Diseases (STDs)  You should be screened each year for STDs including gonorrhea and chlamydia if:  You are sexually active and are younger than 45 years of age.  You are older than 45 years of age and your health care provider tells you that you are at risk for this type of infection.  Your sexual activity has changed since you were last screened and you are at an increased risk for chlamydia or gonorrhea. Ask your health care provider if you are at risk.  Talk with your health care provider about whether you are at high risk of being infected with HIV. Your health care provider may recommend a prescription medicine to help prevent HIV infection. What else can I do?  Schedule regular health, dental, and eye exams.  Stay current with your vaccines (immunizations).  Do not use any tobacco products, such as cigarettes, chewing tobacco, and e-cigarettes. If you need help quitting, ask your health care provider.  Limit alcohol intake to no more than 2 drinks per day. One drink equals 12 ounces of beer, 5 ounces of wine, or 1 ounces of hard liquor.  Do not use street drugs.  Do not share needles.  Ask your health  care provider for help if you need support or information about quitting drugs.  Tell your health care provider if you often feel depressed.  Tell your health care provider if you have ever been abused or do not feel safe at home. This information is not intended to replace advice given to you by your health care provider. Make sure you discuss any questions you have with your health care provider. Document Released: 02/04/2008 Document Revised: 04/06/2016 Document Reviewed: 05/12/2015 Elsevier Interactive Patient Education  2017 [employer]  

## 2017-02-27 ENCOUNTER — Encounter (HOSPITAL_COMMUNITY): Payer: Self-pay | Admitting: Emergency Medicine

## 2017-02-27 ENCOUNTER — Emergency Department (HOSPITAL_COMMUNITY): Payer: BLUE CROSS/BLUE SHIELD

## 2017-02-27 ENCOUNTER — Emergency Department (HOSPITAL_COMMUNITY)
Admission: EM | Admit: 2017-02-27 | Discharge: 2017-02-27 | Disposition: A | Discharge status: Home / Self Care | Payer: BLUE CROSS/BLUE SHIELD | Attending: Emergency Medicine | Admitting: Emergency Medicine

## 2017-02-27 DIAGNOSIS — Z87891 Personal history of nicotine dependence: Secondary | ICD-10-CM | POA: Diagnosis not present

## 2017-02-27 DIAGNOSIS — Z79899 Other long term (current) drug therapy: Secondary | ICD-10-CM | POA: Diagnosis not present

## 2017-02-27 DIAGNOSIS — R05 Cough: Secondary | ICD-10-CM | POA: Insufficient documentation

## 2017-02-27 DIAGNOSIS — M791 Myalgia: Secondary | ICD-10-CM | POA: Diagnosis not present

## 2017-02-27 DIAGNOSIS — J45909 Unspecified asthma, uncomplicated: Secondary | ICD-10-CM | POA: Diagnosis not present

## 2017-02-27 DIAGNOSIS — R0981 Nasal congestion: Secondary | ICD-10-CM | POA: Diagnosis present

## 2017-02-27 DIAGNOSIS — I10 Essential (primary) hypertension: Secondary | ICD-10-CM | POA: Insufficient documentation

## 2017-02-27 DIAGNOSIS — J019 Acute sinusitis, unspecified: Secondary | ICD-10-CM | POA: Insufficient documentation

## 2017-02-27 DIAGNOSIS — R51 Headache: Secondary | ICD-10-CM | POA: Diagnosis not present

## 2017-02-27 MED ORDER — CETIRIZINE-PSEUDOEPHEDRINE ER 5-120 MG PO TB12: 1.0000 | ORAL_TABLET | Freq: Every day | ORAL | 0 refills | Status: DC

## 2017-02-27 MED ORDER — ACETAMINOPHEN 325 MG PO TABS
650.0000 mg | ORAL_TABLET | Freq: Once | ORAL | Status: AC
  Administered 2017-02-27: 650 mg via ORAL
  Filled 2017-02-27: qty 2

## 2017-02-27 MED ORDER — AMOXICILLIN 500 MG PO CAPS: 500.0000 mg | ORAL_CAPSULE | Freq: Three times a day (TID) | ORAL | 0 refills | Status: DC

## 2017-02-27 MED ORDER — FLUTICASONE PROPIONATE 50 MCG/ACT NA SUSP: 2.0000 | Freq: Every day | NASAL | 2 refills | Status: DC

## 2017-02-27 NOTE — ED Triage Notes (Signed)
Pt is c/o headache, sinus congestion, and general body aches  Pt states he has taken Alka Seltzer cold and sinus but it is not helping  Pt states he is having a hard time sleeping  Pt states he had to leave work early today because he felt so bad
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
## 2017-02-27 NOTE — Discharge Instructions (Signed)
Take amoxil as prescribed until all gone. Zyrtec D for sinus congestion. Flonase daily for inflammation. Take tylenol and motrin for fever and pain. Follow up with family doctor in 3 days for recheck

## 2017-04-06 ENCOUNTER — Ambulatory Visit (INDEPENDENT_AMBULATORY_CARE_PROVIDER_SITE_OTHER): Payer: BLUE CROSS/BLUE SHIELD | Admitting: Internal Medicine

## 2017-04-06 ENCOUNTER — Encounter: Payer: Self-pay | Admitting: Internal Medicine

## 2017-04-06 ENCOUNTER — Ambulatory Visit (INDEPENDENT_AMBULATORY_CARE_PROVIDER_SITE_OTHER)
Admission: RE | Admit: 2017-04-06 | Discharge: 2017-04-06 | Disposition: A | Discharge status: Home / Self Care | Payer: BLUE CROSS/BLUE SHIELD | Source: Ambulatory Visit | Attending: Internal Medicine | Admitting: Internal Medicine

## 2017-04-06 ENCOUNTER — Other Ambulatory Visit (INDEPENDENT_AMBULATORY_CARE_PROVIDER_SITE_OTHER): Payer: BLUE CROSS/BLUE SHIELD

## 2017-04-06 VITALS — BP 140/90 | HR 64 | Temp 98.1°F | Resp 16 | Ht 67.0 in | Wt 224.0 lb

## 2017-04-06 DIAGNOSIS — I10 Essential (primary) hypertension: Secondary | ICD-10-CM

## 2017-04-06 DIAGNOSIS — R197 Diarrhea, unspecified: Secondary | ICD-10-CM | POA: Diagnosis not present

## 2017-04-06 DIAGNOSIS — R109 Unspecified abdominal pain: Secondary | ICD-10-CM

## 2017-04-06 LAB — CBC WITH DIFFERENTIAL/PLATELET
Basophils Absolute: 0 10*3/uL (ref 0.0–0.1)
Basophils Relative: 0.1 % (ref 0.0–3.0)
Eosinophils Absolute: 0.6 10*3/uL (ref 0.0–0.7)
Eosinophils Relative: 11.6 % — ABNORMAL HIGH (ref 0.0–5.0)
HCT: 41 % (ref 39.0–52.0)
Hemoglobin: 13.3 g/dL (ref 13.0–17.0)
Lymphocytes Relative: 43.1 % (ref 12.0–46.0)
Lymphs Abs: 2.4 10*3/uL (ref 0.7–4.0)
MCHC: 32.5 g/dL (ref 30.0–36.0)
MCV: 96.4 fl (ref 78.0–100.0)
Monocytes Absolute: 0.4 10*3/uL (ref 0.1–1.0)
Monocytes Relative: 7.2 % (ref 3.0–12.0)
Neutro Abs: 2.1 10*3/uL (ref 1.4–7.7)
Neutrophils Relative %: 38 % — ABNORMAL LOW (ref 43.0–77.0)
Platelets: 250 10*3/uL (ref 150.0–400.0)
RBC: 4.26 Mil/uL (ref 4.22–5.81)
RDW: 12.7 % (ref 11.5–15.5)
WBC: 5.5 10*3/uL (ref 4.0–10.5)

## 2017-04-06 LAB — COMPREHENSIVE METABOLIC PANEL
ALT: 18 U/L (ref 0–53)
AST: 17 U/L (ref 0–37)
Albumin: 4.1 g/dL (ref 3.5–5.2)
Alkaline Phosphatase: 44 U/L (ref 39–117)
BUN: 15 mg/dL (ref 6–23)
CO2: 30 mEq/L (ref 19–32)
Calcium: 9.5 mg/dL (ref 8.4–10.5)
Chloride: 105 mEq/L (ref 96–112)
Creatinine, Ser: 1.08 mg/dL (ref 0.40–1.50)
GFR: 94.96 mL/min (ref >60.00)
Glucose, Bld: 84 mg/dL (ref 70–99)
Potassium: 3.9 mEq/L (ref 3.5–5.1)
Sodium: 140 mEq/L (ref 135–145)
Total Bilirubin: 0.5 mg/dL (ref 0.2–1.2)
Total Protein: 7 g/dL (ref 6.0–8.3)

## 2017-04-06 LAB — SEDIMENTATION RATE: Sed Rate: 11 mm/hr (ref 0–15)

## 2017-04-06 NOTE — Patient Instructions (Signed)

## 2017-04-06 NOTE — Progress Notes (Signed)
Subjective:  Patient ID: Kyle Donovan, male    DOB: [birth year]  Age: 46 y.o. MRN: [id number]  CC: Flank Pain   HPI Kyle Donovan presents for A 2-3 day history of intermittent bloody diarrhea with loss of appetite, bilateral flank pain that he describes as tension, and mild diffuse abdominal pain that he describes as a "knot" in his abdomen. As of this morning his appetite hasdramatically improved and before he came in he ate 2 bananas and a piece of cake.  Outpatient Medications Prior to Visit  Medication Sig Dispense Refill  . Albuterol Sulfate (PROAIR RESPICLICK) 108 (90 Base) MCG/ACT AEPB Inhale 1 Act into the lungs 4 (four) times daily as needed. 1 each 11  . aspirin EC 81 MG tablet Take 1 tablet (81 mg total) by mouth daily. 90 tablet 3  . cetirizine-pseudoephedrine (ZYRTEC-D) 5-120 MG tablet Take 1 tablet by mouth daily. 30 tablet 0  . fluticasone (FLONASE) 50 MCG/ACT nasal spray Place 2 sprays into both nostrils daily. 16 g 2  . nebivolol (BYSTOLIC) 5 MG tablet Take 1 tablet (5 mg total) by mouth daily. 90 tablet 3  . omeprazole (PRILOSEC) 20 MG capsule TAKE ONE CAPSULE TWICE A DAY BEFORE A MEAL 180 capsule 3  . amoxicillin (AMOXIL) 500 MG capsule Take 1 capsule (500 mg total) by mouth 3 (three) times daily. 30 capsule 0   No facility-administered medications prior to visit.     ROS Review of Systems  Constitutional: Positive for appetite change. Negative for diaphoresis, fatigue, fever and unexpected weight change.  HENT: Negative.  Negative for sore throat and trouble swallowing.   Eyes: Negative.   Respiratory: Negative.  Negative for cough, chest tightness, shortness of breath and wheezing.   Cardiovascular: Negative for chest pain, palpitations and leg swelling.  Gastrointestinal: Positive for blood in stool and diarrhea. Negative for abdominal distention, abdominal pain, anal bleeding, constipation, nausea, rectal pain and vomiting.  Endocrine: Negative.     Genitourinary: Positive for flank pain. Negative for difficulty urinating, dysuria, frequency, hematuria, testicular pain and urgency.  Musculoskeletal: Negative for back pain, myalgias and neck pain.  Skin: Negative.  Negative for color change and rash.  Allergic/Immunologic: Negative.   Neurological: Negative.  Negative for dizziness.  Hematological: Negative for adenopathy. Does not bruise/bleed easily.  Psychiatric/Behavioral: Negative.     Objective:  BP 140/90 (BP Location: Left Arm, Patient Position: Sitting, Cuff Size: Normal)   Pulse 64   Temp 98.1 F (36.7 C) (Oral)   Resp 16   Ht 5\' 7"  (1.702 m)   Wt 224 lb (101.6 kg)   SpO2 99%   BMI 35.08 kg/m   BP Readings from Last 3 Encounters:  04/06/17 140/90  02/27/17 133/90  11/24/16 138/88    Wt Readings from Last 3 Encounters:  04/06/17 224 lb (101.6 kg)  02/27/17 226 lb (102.5 kg)  11/24/16 225 lb 0.6 oz (102.1 kg)    Physical Exam  Constitutional: He is oriented to person, place, and time.  Non-toxic appearance. He does not have a sickly appearance. He does not appear ill. No distress.  HENT:  Mouth/Throat: Oropharynx is clear and moist. No oropharyngeal exudate.  Eyes: Conjunctivae are normal. Right eye exhibits no discharge. Left eye exhibits no discharge. No scleral icterus.  Neck: Normal range of motion. Neck supple. No JVD present. No thyromegaly present.  Cardiovascular: Normal rate, regular rhythm and intact distal pulses.  Exam reveals no gallop and no friction rub.   No  murmur heard. Pulmonary/Chest: Effort normal and breath sounds normal. No respiratory distress. He has no wheezes. He has no rales. He exhibits no tenderness.  Abdominal: Soft. Bowel sounds are normal. He exhibits no distension and no mass. There is no hepatosplenomegaly. There is no tenderness. There is no rebound, no guarding and no CVA tenderness.  Genitourinary: Prostate normal. Rectal exam shows guaiac positive stool. Rectal exam shows  no external hemorrhoid, no internal hemorrhoid, no fissure, no mass, no tenderness and anal tone normal. Prostate is not enlarged and not tender.  Musculoskeletal: Normal range of motion. He exhibits no edema, tenderness or deformity.  Lymphadenopathy:    He has no cervical adenopathy.  Neurological: He is alert and oriented to person, place, and time.  Skin: Skin is warm and dry. No rash noted. He is not diaphoretic. No erythema. No pallor.  Vitals reviewed.   Lab Results  Component Value Date   WBC 5.5 04/06/2017   HGB 13.3 04/06/2017   HCT 41.0 04/06/2017   PLT 250.0 04/06/2017   GLUCOSE 84 04/06/2017   CHOL 154 11/24/2016   TRIG 51.0 11/24/2016   HDL 65.60 11/24/2016   LDLCALC 78 11/24/2016   ALT 18 04/06/2017   AST 17 04/06/2017   NA 140 04/06/2017   K 3.9 04/06/2017   CL 105 04/06/2017   CREATININE 1.08 04/06/2017   BUN 15 04/06/2017   CO2 30 04/06/2017   TSH 1.36 11/24/2016   PSA 0.81 11/24/2016    Dg Chest 2 View  Result Date: 02/27/2017 CLINICAL DATA:  46 y/o M; cough, low-grade fever, right-sided chest pain. EXAM: CHEST  2 VIEW COMPARISON:  06/01/2015 chest radiograph FINDINGS: Stable heart size and mediastinal contours are within normal limits. Both lungs are clear. No acute osseous abnormality is evident. Surgical clips project over the right upper chest wall. IMPRESSION: No active cardiopulmonary disease. Electronically Signed   By: Mitzi Hansen M.D.   On: 02/27/2017 21:44    Assessment & Plan:   Kyle Donovan was seen today for flank pain.  Diagnoses and all orders for this visit:  Essential hypertension, benign- his blood pressure is well-controlled, lites and renal function are normal. -     CBC with Differential/Platelet; Future -     Comprehensive metabolic panel; Future  Flank pain, acute- His urine is negative for red blood cells or white blood cells and his renal function is normal indicating no renal pathology. Plain films of the abdomen show  constipation but no other abnormal findings. His WBC and RBC's are normal, liver enzymes are normal, and electrolytes are normal (no acidosis.) This is either musculoskeletal pain or related to an acute viral gastroenteritis. He will let me know if his symptoms worsen. -     CBC with Differential/Platelet; Future -     Comprehensive metabolic panel; Future -     DG Abd Acute W/Chest; Future  Bloody diarrhea- his blood work indicates no serious infection. I've asked him to submit a stool specimen to screen for pathogens. His symptoms are improving indicating this is most likely a viral gastroenteritis. He will let me know if his symptoms do not continue to improve. -     Sedimentation rate; Future -     GI pathogen panel by PCR, stool; Future   I have discontinued Kyle Donovan amoxicillin. I am also having him maintain his Albuterol Sulfate, aspirin EC, omeprazole, nebivolol, cetirizine-pseudoephedrine, and fluticasone.  No orders of the defined types were placed in this encounter.  Follow-up: Return in about 1 week (around 04/13/2017).  Sanda Lingerhomas Gabrielly Mccrystal, MD

## 2017-04-07 ENCOUNTER — Telehealth: Payer: Self-pay | Admitting: Internal Medicine

## 2017-04-07 NOTE — Telephone Encounter (Signed)
FMLA forms have been received. Patient's granddaughter "Maury Dus" is in the hospital, once she has been D/C we will be able to fill out the forms.

## 2017-04-10 ENCOUNTER — Other Ambulatory Visit: Payer: BLUE CROSS/BLUE SHIELD

## 2017-04-10 DIAGNOSIS — R197 Diarrhea, unspecified: Secondary | ICD-10-CM

## 2017-04-13 LAB — GASTROINTESTINAL PATHOGEN PANEL PCR
C. difficile Tox A/B, PCR: NOT DETECTED
Campylobacter, PCR: NOT DETECTED
Cryptosporidium, PCR: NOT DETECTED
E coli (ETEC) LT/ST PCR: NOT DETECTED
E coli (STEC) stx1/stx2, PCR: NOT DETECTED
E coli 0157, PCR: NOT DETECTED
Giardia lamblia, PCR: NOT DETECTED
Norovirus, PCR: NOT DETECTED
Rotavirus A, PCR: NOT DETECTED
Salmonella, PCR: NOT DETECTED
Shigella, PCR: NOT DETECTED

## 2017-04-25 NOTE — Telephone Encounter (Signed)
Patient has called in this morning.  States granddaughters release date is today 04/25/2017.

## 2017-05-01 DIAGNOSIS — Z0279 Encounter for issue of other medical certificate: Secondary | ICD-10-CM

## 2017-05-01 NOTE — Telephone Encounter (Signed)
Yes I still have the forms.

## 2017-05-01 NOTE — Telephone Encounter (Signed)
Forms have been completed and signed, copy sent to scan. Faxed 9/10, original mailed to patient. &charged for.  Patient informed.

## 2017-05-01 NOTE — Telephone Encounter (Signed)
Patient is calling to follow up on FMLA. Please advise.  He is waiting intermitting FMLA, to be out to take the baby to the doctor, etc. Something like 2- 4 times a month.

## 2017-05-01 NOTE — Telephone Encounter (Signed)
Do you still have the FMLA paperwork?

## 2017-09-08 ENCOUNTER — Other Ambulatory Visit: Payer: Self-pay

## 2017-09-08 ENCOUNTER — Emergency Department (HOSPITAL_COMMUNITY)
Admission: EM | Admit: 2017-09-08 | Discharge: 2017-09-08 | Disposition: A | Discharge status: Home / Self Care | Payer: Managed Care, Other (non HMO) | Attending: Emergency Medicine | Admitting: Emergency Medicine

## 2017-09-08 ENCOUNTER — Encounter (HOSPITAL_COMMUNITY): Payer: Self-pay

## 2017-09-08 DIAGNOSIS — Z87891 Personal history of nicotine dependence: Secondary | ICD-10-CM | POA: Insufficient documentation

## 2017-09-08 DIAGNOSIS — I10 Essential (primary) hypertension: Secondary | ICD-10-CM | POA: Insufficient documentation

## 2017-09-08 DIAGNOSIS — Z7982 Long term (current) use of aspirin: Secondary | ICD-10-CM | POA: Insufficient documentation

## 2017-09-08 DIAGNOSIS — J45909 Unspecified asthma, uncomplicated: Secondary | ICD-10-CM | POA: Insufficient documentation

## 2017-09-08 DIAGNOSIS — Z79899 Other long term (current) drug therapy: Secondary | ICD-10-CM | POA: Insufficient documentation

## 2017-09-08 DIAGNOSIS — R112 Nausea with vomiting, unspecified: Secondary | ICD-10-CM | POA: Diagnosis present

## 2017-09-08 DIAGNOSIS — K529 Noninfective gastroenteritis and colitis, unspecified: Secondary | ICD-10-CM | POA: Insufficient documentation

## 2017-09-08 HISTORY — DX: Gastro-esophageal reflux disease without esophagitis: K21.9

## 2017-09-08 LAB — I-STAT CHEM 8, ED
BUN: 15 mg/dL (ref 6–20)
Calcium, Ion: 1.17 mmol/L (ref 1.15–1.40)
Chloride: 104 mmol/L (ref 101–111)
Creatinine, Ser: 1.2 mg/dL (ref 0.61–1.24)
Glucose, Bld: 137 mg/dL — ABNORMAL HIGH (ref 65–99)
HCT: 42 % (ref 39.0–52.0)
Hemoglobin: 14.3 g/dL (ref 13.0–17.0)
Potassium: 3.6 mmol/L (ref 3.5–5.1)
Sodium: 140 mmol/L (ref 135–145)
TCO2: 24 mmol/L (ref 22–32)

## 2017-09-08 MED ORDER — SODIUM CHLORIDE 0.9 % IV SOLN: INTRAVENOUS | Status: DC

## 2017-09-08 MED ORDER — ONDANSETRON 8 MG PO TBDP: 8.0000 mg | ORAL_TABLET | Freq: Three times a day (TID) | ORAL | 0 refills | Status: DC | PRN

## 2017-09-08 MED ORDER — SODIUM CHLORIDE 0.9 % IV BOLUS (SEPSIS)
2000.0000 mL | Freq: Once | INTRAVENOUS | Status: AC
  Administered 2017-09-08: 2000 mL via INTRAVENOUS

## 2017-09-08 MED ORDER — DIPHENOXYLATE-ATROPINE 2.5-0.025 MG PO TABS: 2.0000 | ORAL_TABLET | Freq: Four times a day (QID) | ORAL | 0 refills | Status: DC | PRN

## 2017-09-08 MED ORDER — LOPERAMIDE HCL 2 MG PO CAPS
2.0000 mg | ORAL_CAPSULE | Freq: Once | ORAL | Status: AC
  Administered 2017-09-08: 2 mg via ORAL
  Filled 2017-09-08: qty 1

## 2017-09-08 MED ORDER — ONDANSETRON HCL 4 MG/2ML IJ SOLN
4.0000 mg | Freq: Once | INTRAMUSCULAR | Status: AC
  Administered 2017-09-08: 4 mg via INTRAVENOUS
  Filled 2017-09-08: qty 2

## 2017-09-08 MED ORDER — ONDANSETRON 4 MG PO TBDP
4.0000 mg | ORAL_TABLET | Freq: Once | ORAL | Status: AC | PRN
  Administered 2017-09-08: 4 mg via ORAL
  Filled 2017-09-08: qty 1
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
## 2017-09-08 NOTE — ED Triage Notes (Signed)
Patient c/o generalized abdominal pain, vomiting, and diarrhea since yesterday.

## 2017-09-18 IMAGING — DX DG ABDOMEN ACUTE W/ 1V CHEST
3 series · 3 of 3 positions shown · non-contrast
Comparison: 02/27/2017 .

CLINICAL DATA: Hypertension.  Asthma.  Right flank pain.

EXAM:
DG ABDOMEN ACUTE W/ 1V CHEST

[chest pa]
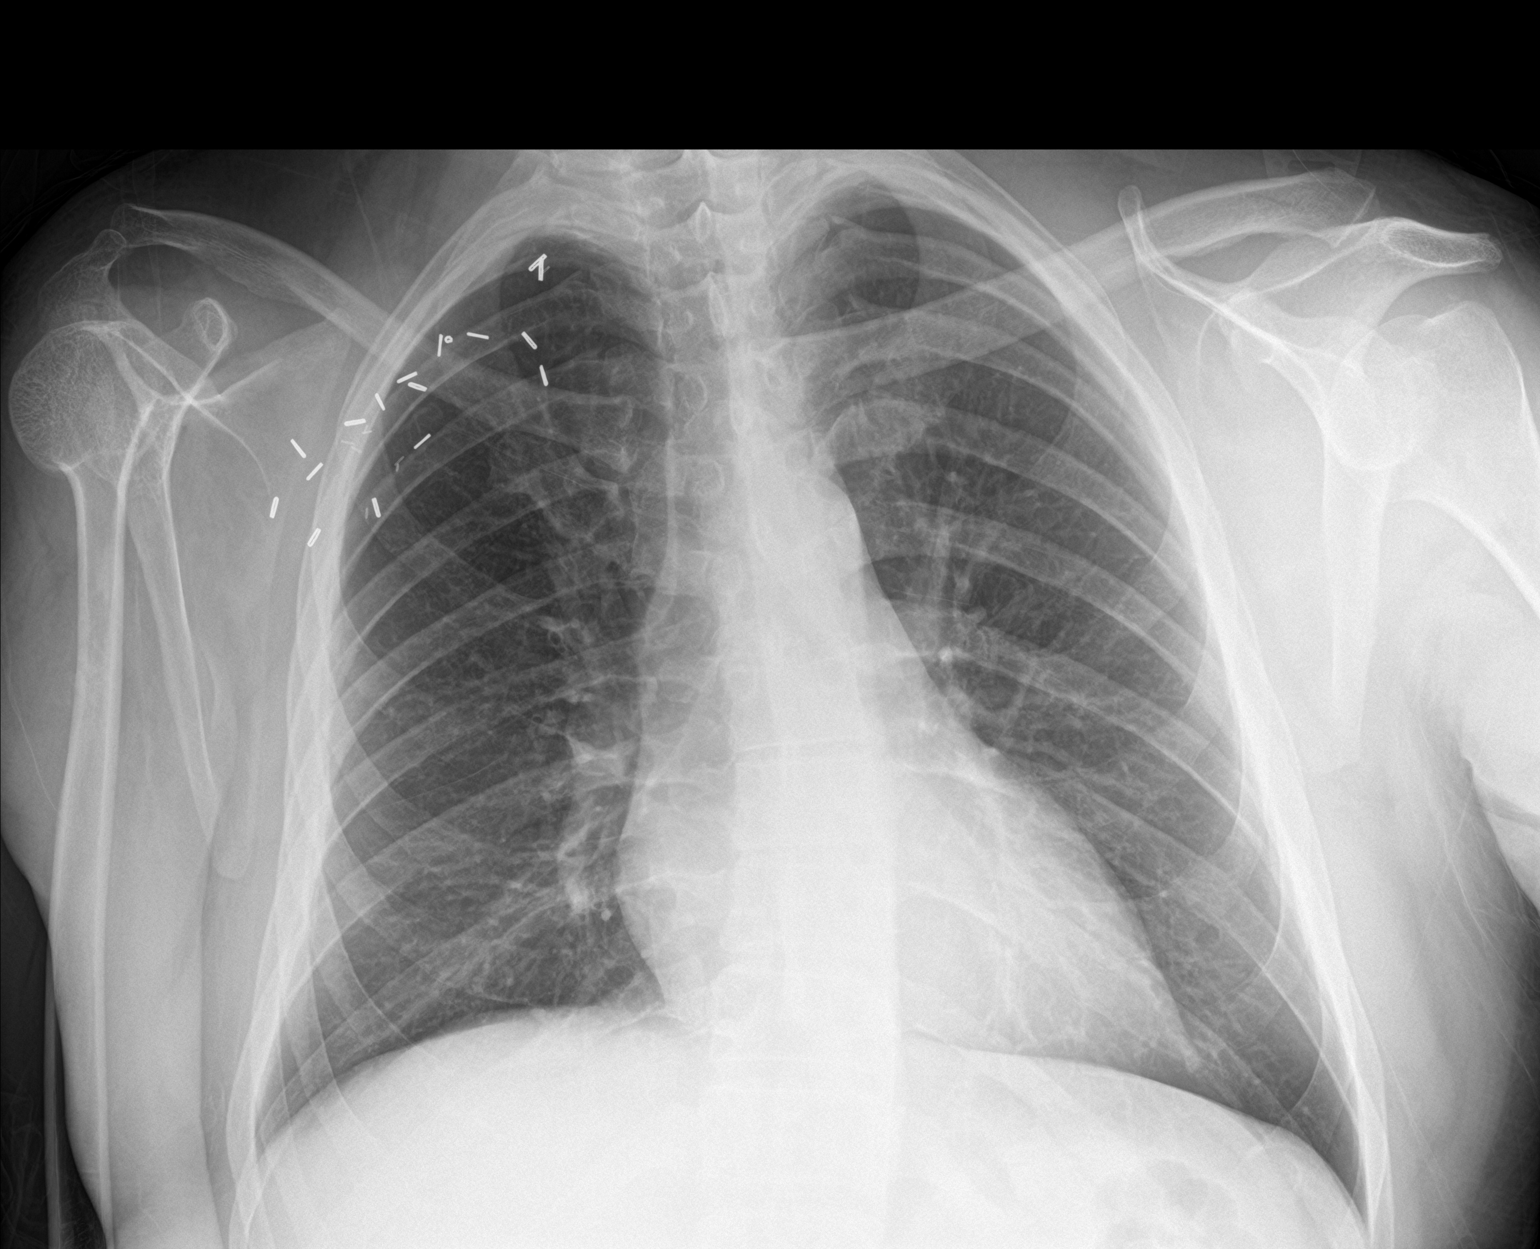

[abdomen erect]
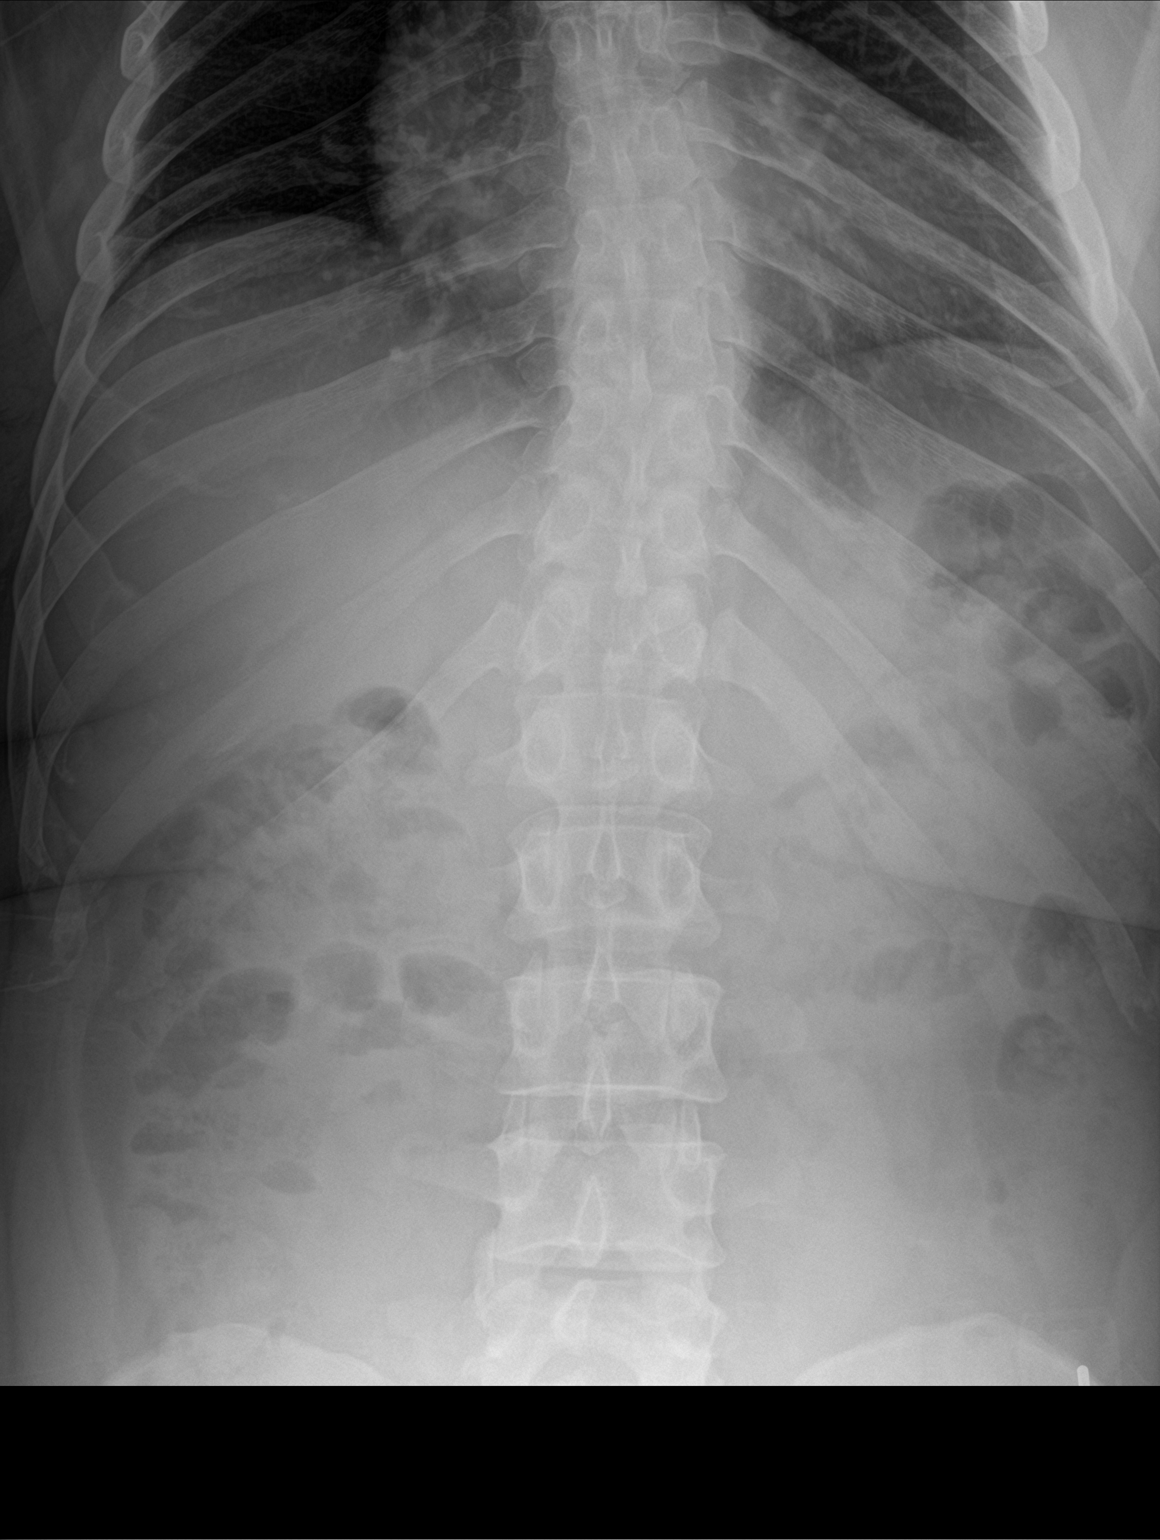

[abdomen supine]
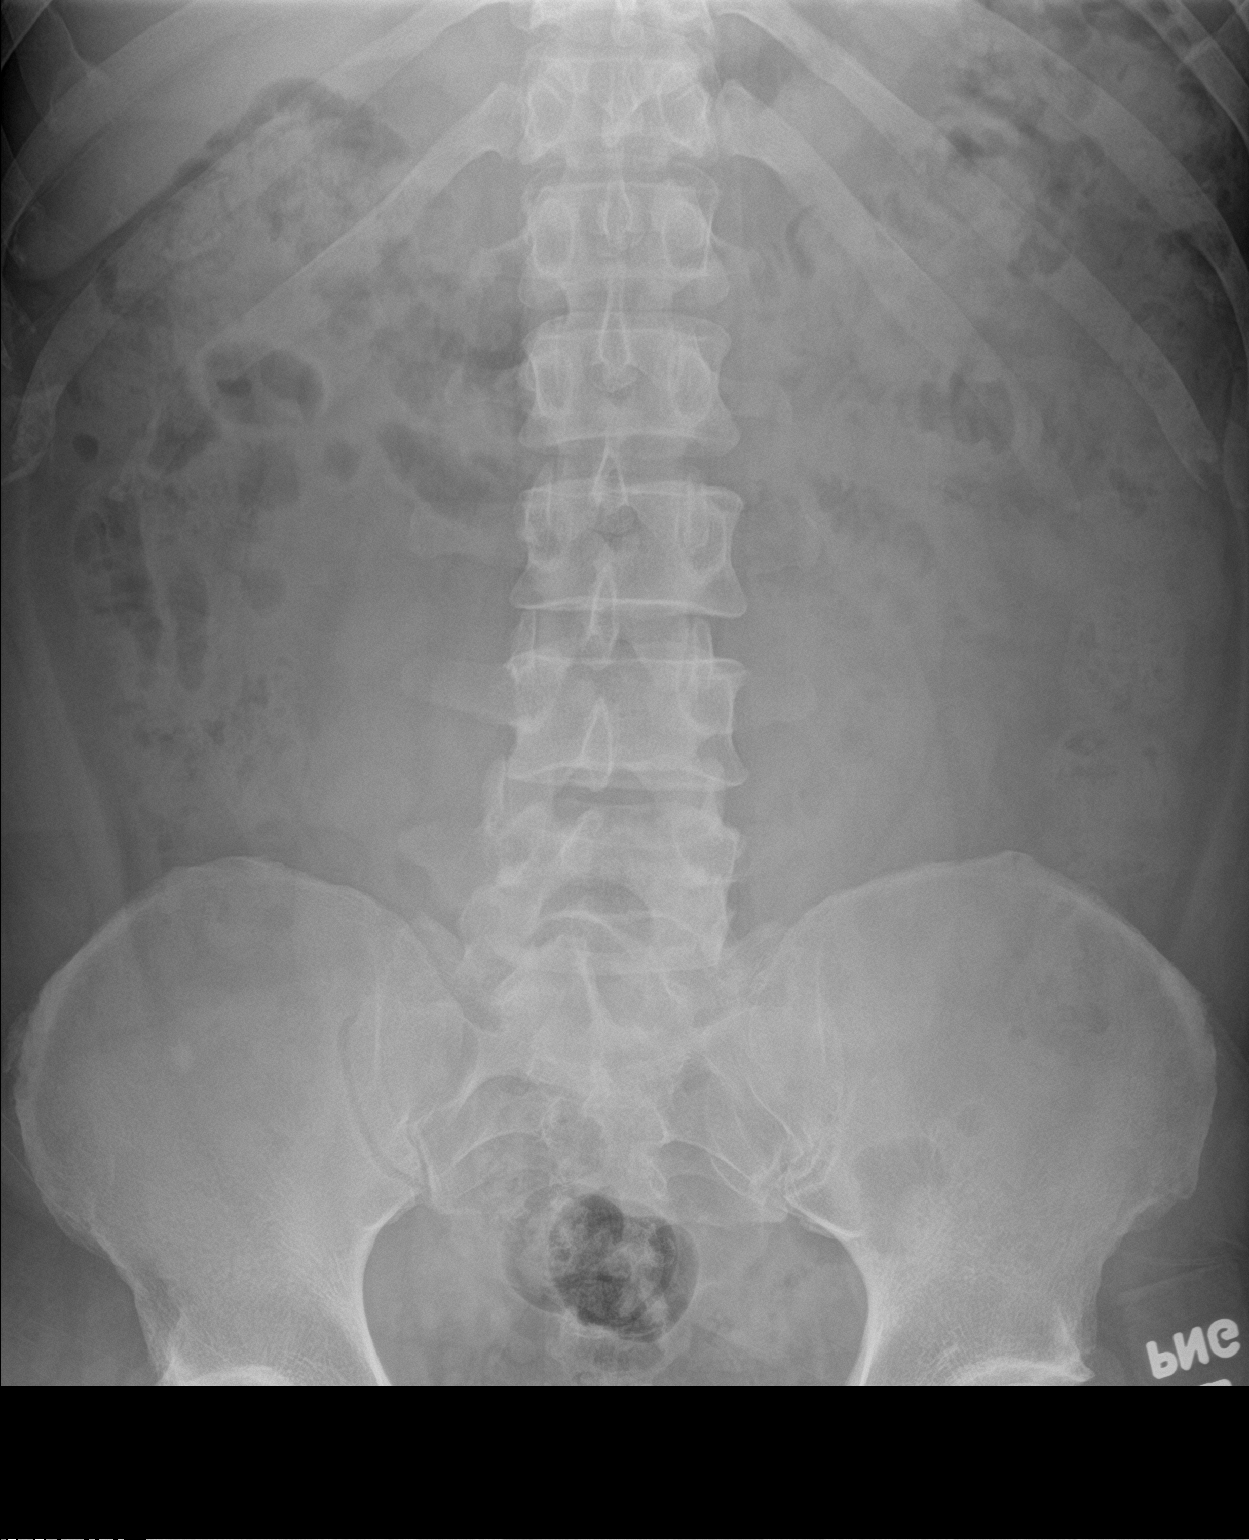

[3 of 3 positions shown; findings below may reference images not displayed]

FINDINGS: Mediastinum and hilar structures normal. Heart size normal. No focal
infiltrate. No pleural effusion or pneumothorax. Thoracic spine
scoliosis. Surgical clips right chest .

Soft tissue structures the abdomen are unremarkable. No pathologic
intra-abdominal calcification noted. Prominent stool volume.
Constipation cannot be excluded. No significant bowel distention.
Several nonspecific nondistended loops of small bowel noted. Small
sclerotic density noted the right iliac wing, most likely a bone
island.
IMPRESSION: 1.  No acute cardiopulmonary disease.

2. Prominent amount of stool noted throughout the colon suggesting
constipation. No bowel distention

## 2017-09-27 ENCOUNTER — Ambulatory Visit (INDEPENDENT_AMBULATORY_CARE_PROVIDER_SITE_OTHER): Payer: Managed Care, Other (non HMO) | Admitting: Family

## 2017-09-27 ENCOUNTER — Encounter: Payer: Self-pay | Admitting: Family

## 2017-09-27 ENCOUNTER — Other Ambulatory Visit: Payer: Managed Care, Other (non HMO)

## 2017-09-27 VITALS — BP 136/80 | HR 81 | Temp 98.0°F | Ht 67.0 in | Wt 224.0 lb

## 2017-09-27 DIAGNOSIS — Z113 Encounter for screening for infections with a predominantly sexual mode of transmission: Secondary | ICD-10-CM

## 2017-09-27 DIAGNOSIS — I10 Essential (primary) hypertension: Secondary | ICD-10-CM | POA: Diagnosis not present
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
## 2017-09-28 LAB — HIV ANTIBODY (ROUTINE TESTING W REFLEX): HIV 1&2 Ab, 4th Generation: NONREACTIVE

## 2017-09-28 LAB — RPR: RPR Ser Ql: NONREACTIVE

## 2017-09-29 LAB — GC/CHLAMYDIA PROBE AMP
Chlamydia trachomatis, NAA: NEGATIVE
Neisseria gonorrhoeae by PCR: NEGATIVE

## 2017-10-26 ENCOUNTER — Encounter: Payer: Self-pay | Admitting: Internal Medicine

## 2017-10-26 ENCOUNTER — Ambulatory Visit (INDEPENDENT_AMBULATORY_CARE_PROVIDER_SITE_OTHER): Payer: Managed Care, Other (non HMO) | Admitting: Internal Medicine

## 2017-10-26 VITALS — BP 140/90 | HR 70 | Temp 98.0°F | Ht 67.0 in | Wt 227.2 lb

## 2017-10-26 DIAGNOSIS — R739 Hyperglycemia, unspecified: Secondary | ICD-10-CM

## 2017-10-26 DIAGNOSIS — I10 Essential (primary) hypertension: Secondary | ICD-10-CM | POA: Diagnosis not present

## 2017-10-26 LAB — POCT GLUCOSE (DEVICE FOR HOME USE): Glucose Fasting, POC: 111 mg/dL — AB (ref 70–99)

## 2017-10-26 LAB — POCT GLYCOSYLATED HEMOGLOBIN (HGB A1C): Hemoglobin A1C: 4.9

## 2017-10-26 NOTE — Progress Notes (Signed)
Subjective:  Patient ID: Kyle Donovan, male    DOB: 1970-12-08  Age: 47 y.o. MRN: [id number]  CC: Hypertension   HPI Kyle Donovan presents for f/up - He was recently seen in the ED for gastroenteritis and was found to have a mildly elevated blood sugar.  His symptoms have resolved.  He feels well today and offers no complaints.  He tells me his blood pressure has been well controlled.  Outpatient Medications Prior to Visit  Medication Sig Dispense Refill  . Albuterol Sulfate (PROAIR RESPICLICK) 108 (90 Base) MCG/ACT AEPB Inhale 1 Act into the lungs 4 (four) times daily as needed. (Patient taking differently: Inhale 1 Act into the lungs 4 (four) times daily as needed (asthma). ) 1 each 11  . fluticasone (FLONASE) 50 MCG/ACT nasal spray Place 2 sprays into both nostrils daily. 16 g 2  . nebivolol (BYSTOLIC) 5 MG tablet Take 1 tablet (5 mg total) by mouth daily. 90 tablet 3  . omeprazole (PRILOSEC) 20 MG capsule TAKE ONE CAPSULE TWICE A DAY BEFORE A MEAL 180 capsule 3   No facility-administered medications prior to visit.     ROS Review of Systems  Constitutional: Negative.  Negative for appetite change, diaphoresis, fatigue and unexpected weight change.  HENT: Negative.   Eyes: Negative for visual disturbance.  Respiratory: Negative for cough, chest tightness, shortness of breath and wheezing.   Cardiovascular: Negative for chest pain, palpitations and leg swelling.  Gastrointestinal: Negative for abdominal pain, constipation, diarrhea, nausea and vomiting.  Endocrine: Negative for polydipsia, polyphagia and polyuria.  Genitourinary: Negative.  Negative for difficulty urinating and dysuria.  Musculoskeletal: Negative.  Negative for arthralgias and myalgias.  Skin: Negative.  Negative for color change and pallor.  Neurological: Negative.  Negative for dizziness, weakness and light-headedness.  Hematological: Negative for adenopathy. Does not bruise/bleed easily.    Psychiatric/Behavioral: Negative.     Objective:  BP 140/90 (BP Location: Left Arm, Patient Position: Sitting, Cuff Size: Large)   Pulse 70   Temp 98 F (36.7 C) (Oral)   Ht 5\' 7"  (1.702 m)   Wt 227 lb 4 oz (103.1 kg)   SpO2 99%   BMI 35.59 kg/m   BP Readings from Last 3 Encounters:  10/26/17 140/90  09/27/17 136/80  09/08/17 106/79    Wt Readings from Last 3 Encounters:  10/26/17 227 lb 4 oz (103.1 kg)  09/27/17 224 lb (101.6 kg)  09/08/17 223 lb (101.2 kg)    Physical Exam  Constitutional: He is oriented to person, place, and time. No distress.  HENT:  Mouth/Throat: Oropharynx is clear and moist. No oropharyngeal exudate.  Eyes: Conjunctivae are normal. Left eye exhibits no discharge. No scleral icterus.  Neck: Normal range of motion. Neck supple. No JVD present. No thyromegaly present.  Cardiovascular: Normal rate, regular rhythm and normal heart sounds. Exam reveals no gallop and no friction rub.  No murmur heard. Pulmonary/Chest: Effort normal. No respiratory distress. He has no wheezes. He has no rales.  Abdominal: Soft. Bowel sounds are normal. He exhibits no distension and no mass. There is no tenderness. There is no guarding.  Musculoskeletal: Normal range of motion. He exhibits no edema, tenderness or deformity.  Lymphadenopathy:    He has no cervical adenopathy.  Neurological: He is alert and oriented to person, place, and time.  Skin: Skin is warm and dry. No rash noted. He is not diaphoretic. No erythema. No pallor.  Vitals reviewed.   Lab Results  Component Value  Date   WBC 5.5 04/06/2017   HGB 14.3 09/08/2017   HCT 42.0 09/08/2017   PLT 250.0 04/06/2017   GLUCOSE 137 (H) 09/08/2017   CHOL 154 11/24/2016   TRIG 51.0 11/24/2016   HDL 65.60 11/24/2016   LDLCALC 78 11/24/2016   ALT 18 04/06/2017   AST 17 04/06/2017   NA 140 09/08/2017   K 3.6 09/08/2017   CL 104 09/08/2017   CREATININE 1.20 09/08/2017   BUN 15 09/08/2017   CO2 30 04/06/2017    TSH 1.36 11/24/2016   PSA 0.81 11/24/2016   HGBA1C 4.9 10/26/2017    No results found.  Assessment & Plan:   Kyle Donovan was seen today for hypertension.  Diagnoses and all orders for this visit:  Hyperglycemia- His blood sugars are normal now. -     Cancel: Basic metabolic panel; Future -     Cancel: Hemoglobin A1c; Future -     POCT glycosylated hemoglobin (Hb A1C) -     POCT Glucose (Device for Home Use)  Essential hypertension, benign- His blood pressure is well controlled.  Recent electrolytes and renal function were normal. -     Cancel: Basic metabolic panel; Future   I am having Kyle Donovan maintain his Albuterol Sulfate, omeprazole, nebivolol, and fluticasone.  No orders of the defined types were placed in this encounter.    Follow-up: Return in about 4 months (around 02/25/2018).  Sanda Lingerhomas Willia Lampert, MD

## 2017-10-26 NOTE — Patient Instructions (Signed)

## 2017-11-20 DIAGNOSIS — J45909 Unspecified asthma, uncomplicated: Secondary | ICD-10-CM | POA: Insufficient documentation

## 2017-11-20 DIAGNOSIS — Z79899 Other long term (current) drug therapy: Secondary | ICD-10-CM | POA: Insufficient documentation

## 2017-11-20 DIAGNOSIS — R1030 Lower abdominal pain, unspecified: Secondary | ICD-10-CM | POA: Diagnosis not present

## 2017-11-20 DIAGNOSIS — Z87891 Personal history of nicotine dependence: Secondary | ICD-10-CM | POA: Insufficient documentation

## 2017-11-20 DIAGNOSIS — R1110 Vomiting, unspecified: Secondary | ICD-10-CM | POA: Insufficient documentation

## 2017-11-20 DIAGNOSIS — I10 Essential (primary) hypertension: Secondary | ICD-10-CM | POA: Insufficient documentation

## 2017-11-21 ENCOUNTER — Encounter (HOSPITAL_COMMUNITY): Payer: Self-pay | Admitting: Emergency Medicine

## 2017-11-21 ENCOUNTER — Emergency Department (HOSPITAL_COMMUNITY)
Admission: EM | Admit: 2017-11-21 | Discharge: 2017-11-21 | Disposition: A | Discharge status: Home / Self Care | Payer: Managed Care, Other (non HMO) | Attending: Emergency Medicine | Admitting: Emergency Medicine

## 2017-11-21 ENCOUNTER — Other Ambulatory Visit: Payer: Self-pay

## 2017-11-21 DIAGNOSIS — R197 Diarrhea, unspecified: Secondary | ICD-10-CM

## 2017-11-21 DIAGNOSIS — R112 Nausea with vomiting, unspecified: Secondary | ICD-10-CM

## 2017-11-21 LAB — COMPREHENSIVE METABOLIC PANEL
ALT: 25 U/L (ref 17–63)
AST: 22 U/L (ref 15–41)
Albumin: 4 g/dL (ref 3.5–5.0)
Alkaline Phosphatase: 52 U/L (ref 38–126)
Anion gap: 11 (ref 5–15)
BUN: 10 mg/dL (ref 6–20)
CO2: 22 mmol/L (ref 22–32)
Calcium: 9.3 mg/dL (ref 8.9–10.3)
Chloride: 107 mmol/L (ref 101–111)
Creatinine, Ser: 1.18 mg/dL (ref 0.61–1.24)
GFR calc Af Amer: >60 mL/min (ref >60)
GFR calc non Af Amer: >60 mL/min (ref >60)
Glucose, Bld: 132 mg/dL — ABNORMAL HIGH (ref 65–99)
Potassium: 3.6 mmol/L (ref 3.5–5.1)
Sodium: 140 mmol/L (ref 135–145)
Total Bilirubin: 0.5 mg/dL (ref 0.3–1.2)
Total Protein: 8.1 g/dL (ref 6.5–8.1)

## 2017-11-21 LAB — CBC
HCT: 44.5 % (ref 39.0–52.0)
Hemoglobin: 15 g/dL (ref 13.0–17.0)
MCH: 32.3 pg (ref 26.0–34.0)
MCHC: 33.7 g/dL (ref 30.0–36.0)
MCV: 95.7 fL (ref 78.0–100.0)
Platelets: 272 10*3/uL (ref 150–400)
RBC: 4.65 MIL/uL (ref 4.22–5.81)
RDW: 12.6 % (ref 11.5–15.5)
WBC: 6.2 10*3/uL (ref 4.0–10.5)

## 2017-11-21 LAB — URINALYSIS, ROUTINE W REFLEX MICROSCOPIC
Bilirubin Urine: NEGATIVE
Glucose, UA: NEGATIVE mg/dL
Ketones, ur: NEGATIVE mg/dL
Leukocytes, UA: NEGATIVE
Nitrite: NEGATIVE
Protein, ur: 100 mg/dL — AB
Specific Gravity, Urine: 1.028 (ref 1.005–1.030)
pH: 5 (ref 5.0–8.0)

## 2017-11-21 LAB — LIPASE, BLOOD: Lipase: 24 U/L (ref 11–51)

## 2017-11-21 MED ORDER — ONDANSETRON HCL 4 MG/2ML IJ SOLN
4.0000 mg | Freq: Once | INTRAMUSCULAR | Status: AC
  Administered 2017-11-21: 4 mg via INTRAVENOUS
  Filled 2017-11-21: qty 2

## 2017-11-21 MED ORDER — DICYCLOMINE HCL 20 MG PO TABS: 20.0000 mg | ORAL_TABLET | Freq: Two times a day (BID) | ORAL | 0 refills | Status: DC | PRN

## 2017-11-21 MED ORDER — SODIUM CHLORIDE 0.9 % IV BOLUS
1000.0000 mL | Freq: Once | INTRAVENOUS | Status: AC
  Administered 2017-11-21: 1000 mL via INTRAVENOUS

## 2017-11-21 MED ORDER — KETOROLAC TROMETHAMINE 30 MG/ML IJ SOLN
30.0000 mg | Freq: Once | INTRAMUSCULAR | Status: AC
  Administered 2017-11-21: 30 mg via INTRAVENOUS
  Filled 2017-11-21: qty 1

## 2017-11-21 MED ORDER — ONDANSETRON 4 MG PO TBDP
4.0000 mg | ORAL_TABLET | Freq: Once | ORAL | Status: AC | PRN
  Administered 2017-11-21: 4 mg via ORAL
  Filled 2017-11-21: qty 1

## 2017-11-21 MED ORDER — DICYCLOMINE HCL 20 MG PO TABS
20.0000 mg | ORAL_TABLET | Freq: Once | ORAL | Status: AC
  Administered 2017-11-21: 20 mg via ORAL
  Filled 2017-11-21: qty 1

## 2017-11-21 MED ORDER — ONDANSETRON 4 MG PO TBDP: 4.0000 mg | ORAL_TABLET | Freq: Three times a day (TID) | ORAL | 0 refills | Status: DC | PRN

## 2017-11-21 NOTE — ED Notes (Signed)
Pt notified of need for urine sample and advised he could not obtain one at this time.

## 2017-11-21 NOTE — Discharge Instructions (Addendum)
Follow up with your primary care doctor to discuss your hospital visit. Continue to hydrate orally with small sips of fluids throughout the day. Use Zofran as directed for nausea & vomiting. Bentyl as needed for abdominal cramping. ° °The 'BRAT' diet is suggested, then progress to diet as tolerated as symptoms abate.  °Bananas.  °Rice.  °Applesauce.  °Toast (and other simple starches such as crackers, potatoes, noodles).  ° °SEEK IMMEDIATE MEDICAL ATTENTION IF: °You begin having localized abdominal pain that does not go away or becomes severe °A temperature above 101 develops °Repeated vomiting occurs (multiple uncontrollable episodes) or you are unable to keep fluids down °Blood is being passed in stools or vomit (bright red or black tarry stools).  °New or worsening symptoms develop, any additional concerns. °
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
## 2017-11-21 NOTE — ED Triage Notes (Signed)
Pt reports having stomach pain with loose stool that started yesterday evening. Pt reports having episode of sweating and nausea.

## 2017-12-02 ENCOUNTER — Other Ambulatory Visit: Payer: Self-pay | Admitting: Internal Medicine

## 2017-12-02 DIAGNOSIS — K219 Gastro-esophageal reflux disease without esophagitis: Secondary | ICD-10-CM

## 2018-02-26 ENCOUNTER — Ambulatory Visit: Payer: Self-pay | Admitting: Internal Medicine

## 2018-02-27 ENCOUNTER — Other Ambulatory Visit: Payer: Self-pay | Admitting: Internal Medicine

## 2018-02-27 DIAGNOSIS — I10 Essential (primary) hypertension: Secondary | ICD-10-CM

## 2018-03-14 ENCOUNTER — Encounter: Payer: Self-pay | Admitting: Internal Medicine

## 2018-03-14 ENCOUNTER — Other Ambulatory Visit (INDEPENDENT_AMBULATORY_CARE_PROVIDER_SITE_OTHER): Payer: Managed Care, Other (non HMO)

## 2018-03-14 ENCOUNTER — Ambulatory Visit (INDEPENDENT_AMBULATORY_CARE_PROVIDER_SITE_OTHER): Payer: Managed Care, Other (non HMO) | Admitting: Internal Medicine

## 2018-03-14 VITALS — BP 152/92 | HR 63 | Temp 99.0°F | Resp 16 | Ht 67.0 in | Wt 221.0 lb

## 2018-03-14 DIAGNOSIS — E559 Vitamin D deficiency, unspecified: Secondary | ICD-10-CM

## 2018-03-14 DIAGNOSIS — R3129 Other microscopic hematuria: Secondary | ICD-10-CM | POA: Diagnosis not present

## 2018-03-14 DIAGNOSIS — I10 Essential (primary) hypertension: Secondary | ICD-10-CM

## 2018-03-14 DIAGNOSIS — Z0000 Encounter for general adult medical examination without abnormal findings: Secondary | ICD-10-CM

## 2018-03-14 DIAGNOSIS — R0683 Snoring: Secondary | ICD-10-CM | POA: Diagnosis not present

## 2018-03-14 LAB — CBC WITH DIFFERENTIAL/PLATELET
Basophils Absolute: 0 10*3/uL (ref 0.0–0.1)
Basophils Relative: 0.7 % (ref 0.0–3.0)
Eosinophils Absolute: 0.3 10*3/uL (ref 0.0–0.7)
Eosinophils Relative: 7.3 % — ABNORMAL HIGH (ref 0.0–5.0)
HCT: 40.8 % (ref 39.0–52.0)
Hemoglobin: 13.6 g/dL (ref 13.0–17.0)
Lymphocytes Relative: 43.6 % (ref 12.0–46.0)
Lymphs Abs: 1.8 10*3/uL (ref 0.7–4.0)
MCHC: 33.4 g/dL (ref 30.0–36.0)
MCV: 95.3 fl (ref 78.0–100.0)
Monocytes Absolute: 0.3 10*3/uL (ref 0.1–1.0)
Monocytes Relative: 6.4 % (ref 3.0–12.0)
Neutro Abs: 1.8 10*3/uL (ref 1.4–7.7)
Neutrophils Relative %: 42 % — ABNORMAL LOW (ref 43.0–77.0)
Platelets: 256 10*3/uL (ref 150.0–400.0)
RBC: 4.28 Mil/uL (ref 4.22–5.81)
RDW: 12.8 % (ref 11.5–15.5)
WBC: 4.2 10*3/uL (ref 4.0–10.5)

## 2018-03-14 LAB — COMPREHENSIVE METABOLIC PANEL
ALT: 17 U/L (ref 0–53)
AST: 17 U/L (ref 0–37)
Albumin: 4.3 g/dL (ref 3.5–5.2)
Alkaline Phosphatase: 45 U/L (ref 39–117)
BUN: 10 mg/dL (ref 6–23)
CO2: 31 mEq/L (ref 19–32)
Calcium: 9.5 mg/dL (ref 8.4–10.5)
Chloride: 105 mEq/L (ref 96–112)
Creatinine, Ser: 1.11 mg/dL (ref 0.40–1.50)
GFR: 91.62 mL/min (ref >60.00)
Glucose, Bld: 92 mg/dL (ref 70–99)
Potassium: 4.2 mEq/L (ref 3.5–5.1)
Sodium: 143 mEq/L (ref 135–145)
Total Bilirubin: 0.5 mg/dL (ref 0.2–1.2)
Total Protein: 7.5 g/dL (ref 6.0–8.3)

## 2018-03-14 LAB — PSA: PSA: 0.93 ng/mL (ref 0.10–4.00)

## 2018-03-14 LAB — LIPID PANEL
Cholesterol: 157 mg/dL (ref 0–200)
HDL: 63.6 mg/dL (ref >39.00)
LDL Cholesterol: 82 mg/dL (ref 0–99)
NonHDL: 92.98
Total CHOL/HDL Ratio: 2
Triglycerides: 54 mg/dL (ref 0.0–149.0)
VLDL: 10.8 mg/dL (ref 0.0–40.0)

## 2018-03-14 LAB — URINALYSIS, ROUTINE W REFLEX MICROSCOPIC
Bilirubin Urine: NEGATIVE
Ketones, ur: NEGATIVE
Leukocytes, UA: NEGATIVE
Nitrite: NEGATIVE
Specific Gravity, Urine: 1.025 (ref 1.000–1.030)
Total Protein, Urine: NEGATIVE
Urine Glucose: NEGATIVE
Urobilinogen, UA: 1 (ref 0.0–1.0)
pH: 6 (ref 5.0–8.0)

## 2018-03-14 LAB — TSH: TSH: 1.75 u[IU]/mL (ref 0.35–4.50)

## 2018-03-14 LAB — VITAMIN D 25 HYDROXY (VIT D DEFICIENCY, FRACTURES): VITD: 18.03 ng/mL — ABNORMAL LOW (ref 30.00–100.00)

## 2018-03-14 MED ORDER — CHOLECALCIFEROL 1.25 MG (50000 UT) PO CAPS: 50000.0000 [IU] | ORAL_CAPSULE | ORAL | 1 refills | Status: DC

## 2018-03-14 MED ORDER — SPIRONOLACTONE 25 MG PO TABS: 25.0000 mg | ORAL_TABLET | Freq: Every day | ORAL | 0 refills | Status: DC

## 2018-03-14 NOTE — Patient Instructions (Signed)

## 2018-03-14 NOTE — Progress Notes (Signed)
Subjective:  Patient ID: Kyle Donovan, male    DOB: 1971/04/26  Age: 47 y.o. MRN: [id number]  CC: Hypertension and Annual Exam   HPI Kyle Donovan presents for a CPX.  He is concerned that his blood pressure is not adequately well controlled.  He complains of heavy snoring but does not know if he has apnea.  He is very active. He denies any recent episodes of headache, blurred vision, DOE, CP, palpitations, edema, or fatigue.  He is compliant with Bystolic.  Outpatient Medications Prior to Visit  Medication Sig Dispense Refill  . BYSTOLIC 5 MG tablet TAKE 1 TABLET BY MOUTH EVERY DAY 90 tablet 0  . omeprazole (PRILOSEC) 20 MG capsule TAKE ONE CAPSULE TWICE A DAY BEFORE A MEAL 180 capsule 0  . Albuterol Sulfate (PROAIR RESPICLICK) 108 (90 Base) MCG/ACT AEPB Inhale 1 Act into the lungs 4 (four) times daily as needed. (Patient not taking: Reported on 03/14/2018) 1 each 11  . dicyclomine (BENTYL) 20 MG tablet Take 1 tablet (20 mg total) by mouth 2 (two) times daily as needed (abdominal cramping). 20 tablet 0  . fluticasone (FLONASE) 50 MCG/ACT nasal spray Place 2 sprays into both nostrils daily. (Patient not taking: Reported on 11/21/2017) 16 g 2  . ondansetron (ZOFRAN ODT) 4 MG disintegrating tablet Take 1 tablet (4 mg total) by mouth every 8 (eight) hours as needed for nausea or vomiting. 20 tablet 0   No facility-administered medications prior to visit.     ROS Review of Systems  Constitutional: Negative for diaphoresis and fatigue.  HENT: Negative.   Eyes: Negative for pain and visual disturbance.  Respiratory: Negative for cough, chest tightness, shortness of breath and wheezing.        ++snoring  Cardiovascular: Negative.  Negative for chest pain, palpitations and leg swelling.  Gastrointestinal: Negative for abdominal pain, constipation, diarrhea, nausea and vomiting.  Endocrine: Negative.   Genitourinary: Negative.  Negative for decreased urine volume, difficulty  urinating, discharge, dysuria, hematuria, penile pain, penile swelling, scrotal swelling and testicular pain.  Musculoskeletal: Negative.  Negative for arthralgias and myalgias.  Skin: Negative.  Negative for color change.  Neurological: Negative.  Negative for dizziness, weakness and headaches.  Hematological: Negative for adenopathy. Does not bruise/bleed easily.  Psychiatric/Behavioral: Negative.     Objective:  BP (!) 152/92 (BP Location: Left Arm, Patient Position: Sitting, Cuff Size: Large)   Pulse 63   Temp 99 F (37.2 C) (Oral)   Resp 16   Ht 5\' 7"  (1.702 m)   Wt 221 lb (100.2 kg)   SpO2 98%   BMI 34.61 kg/m   BP Readings from Last 3 Encounters:  03/14/18 (!) 152/92  11/21/17 132/82  10/26/17 140/90    Wt Readings from Last 3 Encounters:  03/14/18 221 lb (100.2 kg)  11/21/17 224 lb (101.6 kg)  10/26/17 227 lb 4 oz (103.1 kg)    Physical Exam  Constitutional: No distress.  HENT:  Mouth/Throat: Oropharynx is clear and moist. No oropharyngeal exudate.  Eyes: Conjunctivae are normal. No scleral icterus.  Neck: Normal range of motion. Neck supple. No JVD present. No thyromegaly present.  Cardiovascular: Normal rate, regular rhythm and normal heart sounds. Exam reveals no gallop.  No murmur heard. EKG ---  Sinus  Bradycardia  -Anterolateral ST-elevation -repolarization variant.   PROBABLY NORMAL- no change compared tp the prior EKG  Pulmonary/Chest: Effort normal and breath sounds normal. He has no wheezes. He has no rhonchi. He has no rales.  Abdominal: Soft. Normal appearance and bowel sounds are normal. He exhibits no mass. There is no hepatosplenomegaly. There is no tenderness. No hernia. Hernia confirmed negative in the right inguinal area and confirmed negative in the left inguinal area.  Genitourinary: Rectum normal, prostate normal, testes normal and penis normal. Rectal exam shows no external hemorrhoid, no internal hemorrhoid, no fissure, no mass, no  tenderness and anal tone normal. Prostate is not enlarged and not tender. Right testis shows no mass, no swelling and no tenderness. Left testis shows no mass, no swelling and no tenderness. Circumcised. No penile erythema or penile tenderness. No discharge found.  Lymphadenopathy:    He has no cervical adenopathy. No inguinal adenopathy noted on the right or left side.  Skin: He is not diaphoretic.  Vitals reviewed.   Lab Results  Component Value Date   WBC 4.2 03/14/2018   HGB 13.6 03/14/2018   HCT 40.8 03/14/2018   PLT 256.0 03/14/2018   GLUCOSE 92 03/14/2018   CHOL 157 03/14/2018   TRIG 54.0 03/14/2018   HDL 63.60 03/14/2018   LDLCALC 82 03/14/2018   ALT 17 03/14/2018   AST 17 03/14/2018   NA 143 03/14/2018   K 4.2 03/14/2018   CL 105 03/14/2018   CREATININE 1.11 03/14/2018   BUN 10 03/14/2018   CO2 31 03/14/2018   TSH 1.75 03/14/2018   PSA 0.93 03/14/2018   HGBA1C 4.9 10/26/2017    No results found.  Assessment & Plan:   Da was seen today for hypertension and annual exam.  Diagnoses and all orders for this visit:  Essential hypertension, benign- His blood pressure is not adequately well controlled.  I will treat the vitamin D deficiency.  There is blood in his urine but otherwise his labs are negative for secondary causes or endorgan damage.  His EKG is negative for LVH or ischemia.  I have asked him to undergo a sleep evaluation to see if he suffers from sleep apnea.  He has a history of mild hypokalemia so will add spironolactone to the beta-blocker. -     CBC with Differential/Platelet; Future -     Comprehensive metabolic panel; Future -     TSH; Future -     Urinalysis, Routine w reflex microscopic; Future -     VITAMIN D 25 Hydroxy (Vit-D Deficiency, Fractures); Future -     EKG 12-Lead -     spironolactone (ALDACTONE) 25 MG tablet; Take 1 tablet (25 mg total) by mouth daily.  Routine general medical examination at a health care facility-exam  completed, labs reviewed, vaccines reviewed and updated, patient education material was given. -     Lipid panel; Future -     PSA; Future  Snoring -     Ambulatory referral to Sleep Studies  Other microscopic hematuria- He has asymptomatic microscopic hematuria.  I have asked him to undergo a renal CT scan to screen for hydronephrosis, renal cell carcinoma, stones, and bladder cancer.  If the CT is unremarkable then will likely refer to urology for cystoscopy. -     CT RENAL ABD W/WO; Future  Vitamin D deficiency disease -     Cholecalciferol 50000 units capsule; Take 1 capsule (50,000 Units total) by mouth once a week.   I have discontinued Lakai J. Hoos's fluticasone, ondansetron, and dicyclomine. I am also having him start on spironolactone and Cholecalciferol. Additionally, I am having him maintain his Albuterol Sulfate, omeprazole, and BYSTOLIC.  Meds ordered this encounter  Medications  .  spironolactone (ALDACTONE) 25 MG tablet    Sig: Take 1 tablet (25 mg total) by mouth daily.    Dispense:  90 tablet    Refill:  0  . Cholecalciferol 50000 units capsule    Sig: Take 1 capsule (50,000 Units total) by mouth once a week.    Dispense:  12 capsule    Refill:  1     Follow-up: Return in about 2 months (around 05/15/2018).  Sanda Linger, MD

## 2018-03-20 NOTE — Addendum Note (Signed)
Addended by: Verlan FriendsAIRRIKIER DAVIDSON, Laraya Pestka M on: 03/20/2018 09:34 AM   Modules accepted: Orders

## 2018-03-26 ENCOUNTER — Ambulatory Visit (INDEPENDENT_AMBULATORY_CARE_PROVIDER_SITE_OTHER)
Admission: RE | Admit: 2018-03-26 | Discharge: 2018-03-26 | Disposition: A | Discharge status: Home / Self Care | Payer: Managed Care, Other (non HMO) | Source: Ambulatory Visit | Attending: Internal Medicine | Admitting: Internal Medicine

## 2018-03-26 ENCOUNTER — Inpatient Hospital Stay: Admission: RE | Admit: 2018-03-26 | Payer: Self-pay | Source: Ambulatory Visit

## 2018-03-26 DIAGNOSIS — R3129 Other microscopic hematuria: Secondary | ICD-10-CM

## 2018-03-26 MED ORDER — IOPAMIDOL (ISOVUE-300) INJECTION 61%
125.0000 mL | Freq: Once | INTRAVENOUS | Status: AC | PRN
  Administered 2018-03-26: 125 mL via INTRAVENOUS

## 2018-03-27 ENCOUNTER — Other Ambulatory Visit: Payer: Self-pay | Admitting: Internal Medicine

## 2018-03-28 ENCOUNTER — Other Ambulatory Visit: Payer: Self-pay | Admitting: Internal Medicine

## 2018-03-28 DIAGNOSIS — R3129 Other microscopic hematuria: Secondary | ICD-10-CM

## 2018-05-08 ENCOUNTER — Institutional Professional Consult (permissible substitution): Payer: Self-pay | Admitting: Neurology

## 2018-06-18 ENCOUNTER — Ambulatory Visit (INDEPENDENT_AMBULATORY_CARE_PROVIDER_SITE_OTHER): Payer: Managed Care, Other (non HMO) | Admitting: Internal Medicine

## 2018-06-18 ENCOUNTER — Encounter: Payer: Self-pay | Admitting: Internal Medicine

## 2018-06-18 VITALS — BP 138/86 | HR 76 | Temp 97.6°F | Ht 67.0 in | Wt 217.0 lb

## 2018-06-18 DIAGNOSIS — I10 Essential (primary) hypertension: Secondary | ICD-10-CM | POA: Diagnosis not present

## 2018-06-18 MED ORDER — SPIRONOLACTONE 25 MG PO TABS: 25.0000 mg | ORAL_TABLET | Freq: Every day | ORAL | 1 refills | Status: DC

## 2018-06-18 MED ORDER — NEBIVOLOL HCL 5 MG PO TABS: 5.0000 mg | ORAL_TABLET | Freq: Every day | ORAL | 1 refills | Status: DC

## 2018-06-18 NOTE — Progress Notes (Signed)
Subjective:  Patient ID: Kyle Donovan, male    DOB: [birth year]  Age: 47 y.o. MRN: [id number]  CC: Hypertension   HPI Kyle Donovan presents for a BP check - He tells me his blood pressure has been well controlled.  He denies any recent episodes of headache, blurred vision, chest pain, shortness of breath, DOE, or edema.  Outpatient Medications Prior to Visit  Medication Sig Dispense Refill  . Albuterol Sulfate (PROAIR RESPICLICK) 108 (90 Base) MCG/ACT AEPB Inhale 1 Act into the lungs 4 (four) times daily as needed. 1 each 11  . Cholecalciferol 50000 units capsule Take 1 capsule (50,000 Units total) by mouth once a week. 12 capsule 1  . BYSTOLIC 5 MG tablet TAKE 1 TABLET BY MOUTH EVERY DAY 90 tablet 0  . omeprazole (PRILOSEC) 20 MG capsule TAKE ONE CAPSULE TWICE A DAY BEFORE A MEAL 180 capsule 0  . spironolactone (ALDACTONE) 25 MG tablet Take 1 tablet (25 mg total) by mouth daily. 90 tablet 0   No facility-administered medications prior to visit.     ROS Review of Systems  Constitutional: Negative for diaphoresis and fatigue.  HENT: Negative.   Eyes: Negative for visual disturbance.  Respiratory: Negative for cough, chest tightness, shortness of breath and wheezing.   Cardiovascular: Negative for chest pain, palpitations and leg swelling.  Gastrointestinal: Negative for abdominal pain, constipation, diarrhea, nausea and vomiting.  Endocrine: Negative.   Genitourinary: Negative.  Negative for difficulty urinating.  Musculoskeletal: Negative.  Negative for arthralgias and myalgias.  Skin: Negative for color change, pallor and rash.  Neurological: Negative.  Negative for dizziness, weakness and light-headedness.  Hematological: Negative for adenopathy. Does not bruise/bleed easily.  Psychiatric/Behavioral: Negative.     Objective:  BP 138/86   Pulse 76   Temp 97.6 F (36.4 C) (Oral)   Ht 5\' 7"  (1.702 m)   Wt 217 lb (98.4 kg)   SpO2 96%   BMI 33.99 kg/m   BP  Readings from Last 3 Encounters:  06/18/18 138/86  03/14/18 (!) 152/92  11/21/17 132/82    Wt Readings from Last 3 Encounters:  06/18/18 217 lb (98.4 kg)  03/14/18 221 lb (100.2 kg)  11/21/17 224 lb (101.6 kg)    Physical Exam  Constitutional: He is oriented to person, place, and time. No distress.  HENT:  Mouth/Throat: Oropharynx is clear and moist. No oropharyngeal exudate.  Eyes: Conjunctivae are normal. No scleral icterus.  Neck: Normal range of motion. Neck supple. No JVD present. No thyromegaly present.  Cardiovascular: Normal rate, regular rhythm and normal heart sounds. Exam reveals no gallop.  No murmur heard. Pulmonary/Chest: Effort normal and breath sounds normal. No respiratory distress. He has no wheezes. He has no rales.  Abdominal: Soft. Bowel sounds are normal. He exhibits no mass. There is no hepatosplenomegaly. There is no tenderness.  Musculoskeletal: Normal range of motion. He exhibits no edema, tenderness or deformity.  Lymphadenopathy:    He has no cervical adenopathy.  Neurological: He is alert and oriented to person, place, and time.  Skin: Skin is warm and dry. No rash noted. He is not diaphoretic.  Vitals reviewed.   Lab Results  Component Value Date   WBC 4.2 03/14/2018   HGB 13.6 03/14/2018   HCT 40.8 03/14/2018   PLT 256.0 03/14/2018   GLUCOSE 92 03/14/2018   CHOL 157 03/14/2018   TRIG 54.0 03/14/2018   HDL 63.60 03/14/2018   LDLCALC 82 03/14/2018   ALT 17 03/14/2018  AST 17 03/14/2018   NA 143 03/14/2018   K 4.2 03/14/2018   CL 105 03/14/2018   CREATININE 1.11 03/14/2018   BUN 10 03/14/2018   CO2 31 03/14/2018   TSH 1.75 03/14/2018   PSA 0.93 03/14/2018   HGBA1C 4.9 10/26/2017    Ct Abdomen Pelvis W Wo Contrast  Result Date: 03/26/2018 CLINICAL DATA:  Flank pain. Intermittent bloody diarrhea with loss of appetite. EXAM: CT ABDOMEN AND PELVIS WITHOUT AND WITH CONTRAST TECHNIQUE: Multidetector CT imaging of the abdomen and pelvis was  performed following the standard protocol before and following the bolus administration of intravenous contrast. CONTRAST:  ISOVUE-300 IOPAMIDOL (ISOVUE-300) INJECTION 61% COMPARISON:  None FINDINGS: Lower chest: No acute abnormality. Hepatobiliary: No focal liver abnormality is seen. No gallstones, gallbladder wall thickening, or biliary dilatation. Pancreas: Unremarkable. No pancreatic ductal dilatation or surrounding inflammatory changes. Spleen: Normal in size without focal abnormality. Adrenals/Urinary Tract: Normal adrenal glands. No kidney stones identified bilaterally. No ureteral calculi. Normal appearance of both kidneys. No mass or hydronephrosis. The urinary bladder appears normal. Stomach/Bowel: The stomach appears normal. The small bowel loops are nondilated. No small bowel wall thickening or inflammation. The appendix is visualized and appears normal. No pathologic dilatation of the colon. Vascular/Lymphatic: Normal appearance of the abdominal aorta. No enlarged lymph nodes within the abdomen or pelvis. Reproductive: Prostate is unremarkable. Other: There is a small fat containing periumbilical hernia. Musculoskeletal: No acute or significant osseous findings. IMPRESSION: 1. No acute findings within the abdomen or pelvis. No evidence for nephrolithiasis or hydronephrosis. Electronically Signed   By: Signa Kell M.D.   On: 03/26/2018 13:13    Assessment & Plan:   Patrick was seen today for hypertension.  Diagnoses and all orders for this visit:  Essential hypertension, benign- His blood pressure is well controlled on the combination of spironolactone and nebivolol.  Will continue these meds at the current doses.  Will also monitor his electrolytes and renal function. -     Basic metabolic panel; Future -     spironolactone (ALDACTONE) 25 MG tablet; Take 1 tablet (25 mg total) by mouth daily. -     nebivolol (BYSTOLIC) 5 MG tablet; Take 1 tablet (5 mg total) by mouth daily.   I  have discontinued Dragan J. Mccarey's omeprazole. I have also changed his BYSTOLIC to nebivolol. Additionally, I am having him maintain his Albuterol Sulfate, Cholecalciferol, and spironolactone.  Meds ordered this encounter  Medications  . spironolactone (ALDACTONE) 25 MG tablet    Sig: Take 1 tablet (25 mg total) by mouth daily.    Dispense:  90 tablet    Refill:  1  . nebivolol (BYSTOLIC) 5 MG tablet    Sig: Take 1 tablet (5 mg total) by mouth daily.    Dispense:  90 tablet    Refill:  1     Follow-up: Return in about 6 months (around 12/18/2018).  Sanda Linger, MD

## 2018-06-18 NOTE — Patient Instructions (Signed)

## 2018-06-27 ENCOUNTER — Institutional Professional Consult (permissible substitution): Payer: Self-pay | Admitting: Neurology

## 2018-08-13 ENCOUNTER — Institutional Professional Consult (permissible substitution): Payer: Self-pay | Admitting: Neurology

## 2018-10-01 ENCOUNTER — Institutional Professional Consult (permissible substitution): Payer: Self-pay | Admitting: Neurology

## 2018-11-01 ENCOUNTER — Ambulatory Visit (INDEPENDENT_AMBULATORY_CARE_PROVIDER_SITE_OTHER): Payer: Managed Care, Other (non HMO) | Admitting: Family Medicine

## 2018-11-01 ENCOUNTER — Ambulatory Visit (INDEPENDENT_AMBULATORY_CARE_PROVIDER_SITE_OTHER)
Admission: RE | Admit: 2018-11-01 | Discharge: 2018-11-01 | Disposition: A | Discharge status: Home / Self Care | Payer: Managed Care, Other (non HMO) | Source: Ambulatory Visit | Attending: Family Medicine | Admitting: Family Medicine

## 2018-11-01 ENCOUNTER — Encounter: Payer: Self-pay | Admitting: Family Medicine

## 2018-11-01 ENCOUNTER — Other Ambulatory Visit: Payer: Self-pay

## 2018-11-01 VITALS — BP 138/82 | HR 96 | Temp 99.1°F | Ht 67.0 in | Wt 224.0 lb

## 2018-11-01 DIAGNOSIS — R05 Cough: Secondary | ICD-10-CM

## 2018-11-01 DIAGNOSIS — R059 Cough, unspecified: Secondary | ICD-10-CM

## 2018-11-01 DIAGNOSIS — J0140 Acute pansinusitis, unspecified: Secondary | ICD-10-CM

## 2018-11-01 MED ORDER — AMOXICILLIN-POT CLAVULANATE 875-125 MG PO TABS: 1.0000 | ORAL_TABLET | Freq: Two times a day (BID) | ORAL | 0 refills | Status: DC

## 2018-11-01 NOTE — Progress Notes (Signed)
PCP: Etta Grandchild, MD  Subjective:  Kyle Donovan is a 48 y.o. year old very pleasant male patient who presents with symptoms including nasal congestion, sinus pressure/pain, ear pressure, and cough that is nonproductive. Associated mild body aches, chills, sweats are present He also describes "pain in sides" when coughing and sinus pressure/congestion which caused him to seek care today.  -started: 5 to 6  days ago , symptoms are not improving -previous treatments: OTC "allergy medicine" and ASA have provided limited benefit. -sick contacts/travel/risks: denies flu exposure. Recent sick contact exposure with son who has been treated for upper URI -Hx of: asthma  He used albuterol inhaler once which provided benefit.  No influenza vaccine this year. No recent use of antibiotics Former smoker  ROS-denies fever, SOB, chest pain, palpitations, NVD, tooth pain  Pertinent Past Medical History- Asthma   Medications- reviewed  Current Outpatient Medications  Medication Sig Dispense Refill  . Albuterol Sulfate (PROAIR RESPICLICK) 108 (90 Base) MCG/ACT AEPB Inhale 1 Act into the lungs 4 (four) times daily as needed. 1 each 11  . Cholecalciferol 50000 units capsule Take 1 capsule (50,000 Units total) by mouth once a week. 12 capsule 1  . nebivolol (BYSTOLIC) 5 MG tablet Take 1 tablet (5 mg total) by mouth daily. 90 tablet 1  . spironolactone (ALDACTONE) 25 MG tablet Take 1 tablet (25 mg total) by mouth daily. 90 tablet 1   No current facility-administered medications for this visit.     Objective: BP: 183/82, HR: 96, T: 99.1 F, SpO2: 98%, Wt:  224 lb. 0.6 oz, Ht: 5'7" Gen: NAD, resting comfortably HEENT: Turbinates erythematous, TMs normal, pharynx mildly erythematous with no exudate or edema, + frontal and maxillary sinus tenderness/pain with palpation, significant nasal congestion present CV: RRR no murmurs rubs or gallops Lungs: CTAB no crackles, wheeze, rhonchi Ext: no  edema Skin: warm, dry, no rash Neuro: grossly normal, moves all extremities  Assessment/Plan: 1. Acute pansinusitis, recurrence not specified Significant sinus pressure/congestion present. Exam is most consistent with sinusitis. We discussed that symptoms are most likely viral in origin and duration/severity of symptoms are considered for developing plan of care. Advised use of nasal saline rinses and supportive measures:  Get rest, drink plenty of fluids, and use tylenol or ibuprofen as needed for discomfort. Mucinex and Delsym can be added if needed. Provided Rx for Augmentin to use if symptoms are not improving in the next 3 to 4 days.  Follow up if fever >101, if symptoms worsen or if symptoms are not improving. Patient verbalizes understanding.   - amoxicillin-clavulanate (AUGMENTIN) 875-125 MG tablet; Take 1 tablet by mouth 2 (two) times daily. (Patient not taking: Reported on 11/01/2018)  Dispense: 20 tablet; Refill: 0  2. Cough Pain with coughing present, history of asthma. Lungs CTA and no SOB present. No coughing during exam.  Patient is highly concerned about pain with coughing today. Will obtain chest X-ray for further evaluation. Reviewed appropriate use of rescue inhaler if needed. Further advised that is develops SOB or increased use of albuterol, follow up for further evaluation and treatment.  - DG Chest 2 View; Future  Work note provided today.  Finally, we reviewed reasons to return to care including if symptoms worsen or persist or new concerns arise- once again particularly shortness of breath or fever.    Inez Catalina, FNP

## 2018-11-12 ENCOUNTER — Institutional Professional Consult (permissible substitution): Payer: Self-pay | Admitting: Neurology

## 2018-11-22 ENCOUNTER — Encounter: Payer: Self-pay | Admitting: Neurology

## 2018-11-22 ENCOUNTER — Telehealth: Payer: Self-pay | Admitting: Neurology

## 2018-11-22 NOTE — Telephone Encounter (Signed)
Called the patient to inform them that our office has placed new protocols in place for our office visits. Due to the virus pandemic our office is reducing our number of office visits in order to minimize the risk to our patients and healthcare providers. Advised that our office is now providing the capability to offer the patients phone visits at this time. Informed of what that process looks like and informed that the telephone office visit will still be billed through insurance and due to [place] we need them to know since the appointment is taking place over the phone, we can't guarantee the security of the phone line. With that said if we do move forward I would have to get verbal consent to completed the call over the phone.  Patient gave verbal consent to complete the video visit. Pt's email is [email or web address]. Pt understands that the [employer] webex software must be downloaded and operational on the device pt plans to use for the visit. Went ahead and reviewed the patient's chart and made sure it was up to date for the upcoming visit. Advised the patient to call me with any questions that he may have between now and he apt. Also advised the patient that in the email will be a sleep scale for him to complete as well as measuring the neck circumference for the upcoming visit. Pt verbalized understanding.

## 2018-12-03 ENCOUNTER — Other Ambulatory Visit: Payer: Self-pay

## 2018-12-03 ENCOUNTER — Encounter: Payer: Self-pay | Admitting: Neurology

## 2018-12-03 ENCOUNTER — Ambulatory Visit (INDEPENDENT_AMBULATORY_CARE_PROVIDER_SITE_OTHER): Payer: Managed Care, Other (non HMO) | Admitting: Neurology

## 2018-12-03 ENCOUNTER — Institutional Professional Consult (permissible substitution): Payer: Self-pay | Admitting: Neurology

## 2018-12-03 DIAGNOSIS — K219 Gastro-esophageal reflux disease without esophagitis: Secondary | ICD-10-CM

## 2018-12-03 DIAGNOSIS — I771 Stricture of artery: Secondary | ICD-10-CM | POA: Diagnosis not present

## 2018-12-03 DIAGNOSIS — R0683 Snoring: Secondary | ICD-10-CM

## 2018-12-03 DIAGNOSIS — I10 Essential (primary) hypertension: Secondary | ICD-10-CM

## 2018-12-03 DIAGNOSIS — J0141 Acute recurrent pansinusitis: Secondary | ICD-10-CM

## 2018-12-03 DIAGNOSIS — J4520 Mild intermittent asthma, uncomplicated: Secondary | ICD-10-CM

## 2018-12-03 NOTE — Patient Instructions (Signed)
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
## 2018-12-18 ENCOUNTER — Institutional Professional Consult (permissible substitution): Payer: Self-pay | Admitting: Neurology

## 2019-01-23 ENCOUNTER — Telehealth: Payer: Self-pay

## 2019-01-23 NOTE — Telephone Encounter (Signed)
We have attempted to call the patient two times to schedule sleep study.  Patient has been unavailable at the phone numbers we have on file and has not returned our calls.  At this point we will send a letter asking patient to please contact the sleep lab to schedule their sleep study.  If patient calls back we will schedule them for their sleep study. 

## 2019-02-28 ENCOUNTER — Other Ambulatory Visit: Payer: Self-pay

## 2019-02-28 ENCOUNTER — Emergency Department (HOSPITAL_COMMUNITY)
Admission: EM | Admit: 2019-02-28 | Discharge: 2019-03-01 | Disposition: A | Discharge status: Home / Self Care | Payer: Managed Care, Other (non HMO) | Attending: Emergency Medicine | Admitting: Emergency Medicine

## 2019-02-28 ENCOUNTER — Emergency Department (HOSPITAL_COMMUNITY): Payer: Managed Care, Other (non HMO)

## 2019-02-28 DIAGNOSIS — Z79899 Other long term (current) drug therapy: Secondary | ICD-10-CM | POA: Insufficient documentation

## 2019-02-28 DIAGNOSIS — R079 Chest pain, unspecified: Secondary | ICD-10-CM

## 2019-02-28 DIAGNOSIS — Z79891 Long term (current) use of opiate analgesic: Secondary | ICD-10-CM | POA: Diagnosis not present

## 2019-02-28 DIAGNOSIS — J45909 Unspecified asthma, uncomplicated: Secondary | ICD-10-CM | POA: Insufficient documentation

## 2019-02-28 DIAGNOSIS — I10 Essential (primary) hypertension: Secondary | ICD-10-CM | POA: Insufficient documentation

## 2019-02-28 DIAGNOSIS — R0789 Other chest pain: Secondary | ICD-10-CM | POA: Insufficient documentation

## 2019-02-28 LAB — CBC
HCT: 39.6 % (ref 39.0–52.0)
Hemoglobin: 13.1 g/dL (ref 13.0–17.0)
MCH: 31.7 pg (ref 26.0–34.0)
MCHC: 33.1 g/dL (ref 30.0–36.0)
MCV: 95.9 fL (ref 80.0–100.0)
Platelets: 242 10*3/uL (ref 150–400)
RBC: 4.13 MIL/uL — ABNORMAL LOW (ref 4.22–5.81)
RDW: 11.5 % (ref 11.5–15.5)
WBC: 7 10*3/uL (ref 4.0–10.5)
nRBC: 0 % (ref 0.0–0.2)

## 2019-02-28 LAB — BASIC METABOLIC PANEL
Anion gap: 11 (ref 5–15)
BUN: 11 mg/dL (ref 6–20)
CO2: 24 mmol/L (ref 22–32)
Calcium: 9.7 mg/dL (ref 8.9–10.3)
Chloride: 106 mmol/L (ref 98–111)
Creatinine, Ser: 1.27 mg/dL — ABNORMAL HIGH (ref 0.61–1.24)
GFR calc Af Amer: >60 mL/min (ref >60)
GFR calc non Af Amer: >60 mL/min (ref >60)
Glucose, Bld: 96 mg/dL (ref 70–99)
Potassium: 3.6 mmol/L (ref 3.5–5.1)
Sodium: 141 mmol/L (ref 135–145)

## 2019-02-28 LAB — TROPONIN I (HIGH SENSITIVITY): Troponin I (High Sensitivity): <2 ng/L (ref <18)

## 2019-02-28 MED ORDER — SODIUM CHLORIDE 0.9% FLUSH: 3.0000 mL | Freq: Once | INTRAVENOUS | Status: DC

## 2019-02-28 MED ORDER — SODIUM CHLORIDE 0.9 % IV BOLUS
1000.0000 mL | Freq: Once | INTRAVENOUS | Status: AC
  Administered 2019-02-28: 1000 mL via INTRAVENOUS

## 2019-02-28 MED ORDER — ONDANSETRON HCL 4 MG/2ML IJ SOLN
4.0000 mg | Freq: Once | INTRAMUSCULAR | Status: AC
  Administered 2019-02-28: 4 mg via INTRAVENOUS
  Filled 2019-02-28: qty 2

## 2019-02-28 MED ORDER — ACETAMINOPHEN 325 MG PO TABS
650.0000 mg | ORAL_TABLET | Freq: Once | ORAL | Status: AC
Start: 2019-02-28 — End: 2019-02-28
  Administered 2019-02-28: 650 mg via ORAL
  Filled 2019-02-28: qty 2

## 2019-02-28 NOTE — Discharge Instructions (Addendum)
You were seen in the emergency department for chest pain.  You had blood work, EKG, and a chest x-ray that did not show any evidence of any serious findings.  It will be important for you to follow-up with your regular doctor for a stress test.  Return to the ED if your chest pain becomes exertional, associated with shortness of breath, nausea, vomiting, sweating or other concerns.  Please return if any worsening symptoms.

## 2019-02-28 NOTE — ED Triage Notes (Signed)
Came in via ems; c/o chest pain x 1.5 hrs ago. Reported pain relief from NTG x2 given by EMS along w/ ASA 324.
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
## 2019-03-01 LAB — TROPONIN I (HIGH SENSITIVITY): Troponin I (High Sensitivity): <2 ng/L (ref <18)

## 2019-03-01 NOTE — ED Notes (Signed)
Patient verbalizes understanding of discharge instructions. Opportunity for questioning and answers were provided. Armband removed by staff, pt discharged from ED ambulatory.   

## 2019-03-01 NOTE — ED Provider Notes (Signed)
Care assumed from Dr. Melina Copa.  Patient awaiting second troponin for atypical chest pain.  Second troponin is negative.  Chest pain has resolved.  Plan for discharge with outpatient follow-up per plan established by Dr. Melina Copa.  Return precautions discussed including exertional chest pain, shortness of breath, nausea, vomiting, diaphoresis or any concerns.   Ezequiel Essex, MD 03/01/19 0700

## 2019-03-18 ENCOUNTER — Ambulatory Visit (INDEPENDENT_AMBULATORY_CARE_PROVIDER_SITE_OTHER): Payer: Managed Care, Other (non HMO) | Admitting: Internal Medicine

## 2019-03-18 ENCOUNTER — Other Ambulatory Visit: Payer: Self-pay

## 2019-03-18 ENCOUNTER — Encounter: Payer: Self-pay | Admitting: Internal Medicine

## 2019-03-18 ENCOUNTER — Ambulatory Visit: Payer: Managed Care, Other (non HMO) | Admitting: Internal Medicine

## 2019-03-18 VITALS — BP 138/92 | HR 79 | Temp 98.3°F | Resp 16 | Ht 67.0 in | Wt 224.0 lb

## 2019-03-18 DIAGNOSIS — Z0000 Encounter for general adult medical examination without abnormal findings: Secondary | ICD-10-CM

## 2019-03-18 DIAGNOSIS — I10 Essential (primary) hypertension: Secondary | ICD-10-CM | POA: Diagnosis not present

## 2019-03-18 MED ORDER — NEBIVOLOL HCL 5 MG PO TABS: 5.0000 mg | ORAL_TABLET | Freq: Every day | ORAL | 0 refills | Status: DC

## 2019-03-18 NOTE — Patient Instructions (Signed)

## 2019-03-18 NOTE — Progress Notes (Signed)
Subjective:  Patient ID: Kyle Donovan, male    DOB: Mar 20, 1971  Age: 48 y.o. MRN: [id number]  CC: Hypertension   HPI Kyle Donovan presents for f/up - He is concerned that his blood pressure is not adequately well controlled.  He ran out of Bystolic a couple months ago.  He was seen in the ED a few weeks ago over concerns that he was dehydrated.  His lab work was unremarkable.  He denies any recent episodes of headache, blurred vision, chest pain, shortness of breath, dizziness, lightheadedness, or edema.  Outpatient Medications Prior to Visit  Medication Sig Dispense Refill  . Albuterol Sulfate (PROAIR RESPICLICK) 108 (90 Base) MCG/ACT AEPB Inhale 1 Act into the lungs 4 (four) times daily as needed. 1 each 11  . nebivolol (BYSTOLIC) 5 MG tablet Take 1 tablet (5 mg total) by mouth daily. 90 tablet 1   No facility-administered medications prior to visit.     ROS Review of Systems  Constitutional: Negative.  Negative for diaphoresis, fatigue and unexpected weight change.  HENT: Negative.   Eyes: Negative for visual disturbance.  Respiratory: Negative for cough, chest tightness, shortness of breath and wheezing.   Cardiovascular: Negative for chest pain, palpitations and leg swelling.  Gastrointestinal: Negative for abdominal pain, diarrhea, nausea and vomiting.  Endocrine: Negative.   Genitourinary: Negative.  Negative for difficulty urinating.  Musculoskeletal: Negative.  Negative for arthralgias and myalgias.  Skin: Negative for color change and pallor.  Neurological: Negative.  Negative for dizziness, weakness and light-headedness.  Hematological: Negative for adenopathy. Does not bruise/bleed easily.  Psychiatric/Behavioral: Negative.     Objective:  BP (!) 138/92 (BP Location: Left Arm, Patient Position: Sitting, Cuff Size: Normal)   Pulse 79   Temp 98.3 F (36.8 C) (Oral)   Resp 16   Ht 5\' 7"  (1.702 m)   Wt 224 lb (101.6 kg)   SpO2 95%   BMI 35.08 kg/m    BP Readings from Last 3 Encounters:  03/18/19 (!) 138/92  03/01/19 (!) 124/94  11/01/18 138/82    Wt Readings from Last 3 Encounters:  03/18/19 224 lb (101.6 kg)  02/28/19 225 lb (102.1 kg)  11/01/18 224 lb 0.6 oz (101.6 kg)    Physical Exam Vitals signs reviewed.  Constitutional:      Appearance: He is obese. He is not ill-appearing or diaphoretic.  HENT:     Nose: Nose normal.     Mouth/Throat:     Mouth: Mucous membranes are moist.  Eyes:     General: No scleral icterus.    Conjunctiva/sclera: Conjunctivae normal.  Neck:     Musculoskeletal: Normal range of motion. No neck rigidity.  Cardiovascular:     Rate and Rhythm: Normal rate and regular rhythm.     Heart sounds: No murmur. No gallop.   Pulmonary:     Effort: Pulmonary effort is normal. No respiratory distress.     Breath sounds: No stridor. No wheezing, rhonchi or rales.  Abdominal:     General: Abdomen is protuberant. Bowel sounds are normal. There is no distension.     Palpations: Abdomen is soft. There is no hepatomegaly or splenomegaly.     Tenderness: There is no abdominal tenderness.  Musculoskeletal: Normal range of motion.        General: No swelling.     Right lower leg: No edema.     Left lower leg: No edema.  Lymphadenopathy:     Cervical: No cervical adenopathy.  Skin:  General: Skin is warm and dry.  Neurological:     General: No focal deficit present.     Mental Status: He is alert and oriented to person, place, and time. Mental status is at baseline.  Psychiatric:        Mood and Affect: Mood normal.        Behavior: Behavior normal.     Lab Results  Component Value Date   WBC 7.0 02/28/2019   HGB 13.1 02/28/2019   HCT 39.6 02/28/2019   PLT 242 02/28/2019   GLUCOSE 96 02/28/2019   CHOL 157 03/14/2018   TRIG 54.0 03/14/2018   HDL 63.60 03/14/2018   LDLCALC 82 03/14/2018   ALT 17 03/14/2018   AST 17 03/14/2018   NA 141 02/28/2019   K 3.6 02/28/2019   CL 106 02/28/2019    CREATININE 1.27 (H) 02/28/2019   BUN 11 02/28/2019   CO2 24 02/28/2019   TSH 1.75 03/14/2018   PSA 0.93 03/14/2018   HGBA1C 4.9 10/26/2017    Dg Chest 2 View  Result Date: 02/28/2019 CLINICAL DATA:  Chest pain for 2 hours EXAM: CHEST - 2 VIEW COMPARISON:  11/01/2018 FINDINGS: Cardiac shadow is within normal limits. The lungs are clear. Postsurgical changes over right upper chest are seen. No bony abnormality is noted. IMPRESSION: No active cardiopulmonary disease. Electronically Signed   By: Inez Catalina M.D.   On: 02/28/2019 21:56    Assessment & Plan:   Adhrit was seen today for hypertension.  Diagnoses and all orders for this visit:  Essential hypertension, benign- His blood pressure is not adequately well controlled and he is noncompliant with Bystolic and lifestyle modifications.  I have asked him to restart Bystolic and to continue working on his lifestyle modifications.  He will return in 3 months for recheck of his blood pressure and for CPX. -     nebivolol (BYSTOLIC) 5 MG tablet; Take 1 tablet (5 mg total) by mouth daily.  Routine general medical examination at a health care facility -     Lipid panel; Future -     PSA; Future   I am having Genie J. Worland maintain his Albuterol Sulfate and nebivolol.  Meds ordered this encounter  Medications  . nebivolol (BYSTOLIC) 5 MG tablet    Sig: Take 1 tablet (5 mg total) by mouth daily.    Dispense:  90 tablet    Refill:  0     Follow-up: Return in about 3 months (around 06/18/2019).  Kyle Calico, MD

## 2019-07-23 ENCOUNTER — Ambulatory Visit (INDEPENDENT_AMBULATORY_CARE_PROVIDER_SITE_OTHER): Payer: Managed Care, Other (non HMO) | Admitting: Internal Medicine

## 2019-07-23 DIAGNOSIS — J329 Chronic sinusitis, unspecified: Secondary | ICD-10-CM | POA: Diagnosis not present

## 2019-07-23 DIAGNOSIS — J069 Acute upper respiratory infection, unspecified: Secondary | ICD-10-CM | POA: Diagnosis not present

## 2019-07-23 DIAGNOSIS — K219 Gastro-esophageal reflux disease without esophagitis: Secondary | ICD-10-CM

## 2019-07-23 DIAGNOSIS — J4520 Mild intermittent asthma, uncomplicated: Secondary | ICD-10-CM | POA: Diagnosis not present

## 2019-07-23 MED ORDER — AZITHROMYCIN 250 MG PO TABS: ORAL_TABLET | ORAL | 0 refills | Status: DC

## 2019-07-23 NOTE — Patient Instructions (Signed)
Please take all new medication as prescribed - the antibiotic  You can also take Delsym OTC for cough, and/or Mucinex (or it's generic off brand) for congestion, and tylenol as needed for pain.  [facility]  Please continue all other medications as before, and refills have been done if requested.  Please have the pharmacy call with any other refills you may need.  Please keep your appointments with your specialists as you may have planned
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
## 2019-07-24 ENCOUNTER — Other Ambulatory Visit: Payer: Self-pay

## 2019-07-24 ENCOUNTER — Ambulatory Visit: Payer: Managed Care, Other (non HMO) | Admitting: Internal Medicine

## 2019-07-24 DIAGNOSIS — Z20822 Contact with and (suspected) exposure to covid-19: Secondary | ICD-10-CM

## 2019-07-25 ENCOUNTER — Telehealth: Payer: Self-pay

## 2019-07-25 NOTE — Telephone Encounter (Signed)
Call placed to patient.  Left vm. To call [phone], and talk to a nurse about need to retest for COVID.  (per [facility] there was no label on the specimen)  When pt. calls back, he will need to be instructed on need to retest.

## 2019-07-25 NOTE — Telephone Encounter (Signed)
Audie Pinto from [employer] is stating that patient specimen was not label and that patient will need to retest.

## 2019-07-26 ENCOUNTER — Other Ambulatory Visit: Payer: Self-pay | Admitting: Internal Medicine

## 2019-07-26 ENCOUNTER — Other Ambulatory Visit: Payer: Self-pay

## 2019-07-26 DIAGNOSIS — Z20822 Contact with and (suspected) exposure to covid-19: Secondary | ICD-10-CM

## 2019-07-26 DIAGNOSIS — I10 Essential (primary) hypertension: Secondary | ICD-10-CM

## 2019-07-27 ENCOUNTER — Encounter: Payer: Self-pay | Admitting: Internal Medicine

## 2019-07-27 DIAGNOSIS — Z87891 Personal history of nicotine dependence: Secondary | ICD-10-CM | POA: Diagnosis not present

## 2019-07-27 DIAGNOSIS — I10 Essential (primary) hypertension: Secondary | ICD-10-CM | POA: Diagnosis not present

## 2019-07-27 DIAGNOSIS — Z79899 Other long term (current) drug therapy: Secondary | ICD-10-CM | POA: Insufficient documentation

## 2019-07-27 DIAGNOSIS — J45909 Unspecified asthma, uncomplicated: Secondary | ICD-10-CM | POA: Insufficient documentation

## 2019-07-27 DIAGNOSIS — J069 Acute upper respiratory infection, unspecified: Secondary | ICD-10-CM | POA: Insufficient documentation

## 2019-07-27 DIAGNOSIS — R05 Cough: Secondary | ICD-10-CM | POA: Insufficient documentation

## 2019-07-27 DIAGNOSIS — M549 Dorsalgia, unspecified: Secondary | ICD-10-CM | POA: Insufficient documentation

## 2019-07-27 DIAGNOSIS — R509 Fever, unspecified: Secondary | ICD-10-CM | POA: Insufficient documentation

## 2019-07-27 DIAGNOSIS — R519 Headache, unspecified: Secondary | ICD-10-CM | POA: Diagnosis present

## 2019-07-27 NOTE — Assessment & Plan Note (Signed)
stable overall by history and exam, recent data reviewed with pt, and pt to continue medical treatment as before,  to f/u any worsening symptoms or concerns  

## 2019-07-27 NOTE — Assessment & Plan Note (Signed)
Mild to mod, for antibx course,  to f/u any worsening symptoms or concerns 

## 2019-07-28 ENCOUNTER — Encounter (HOSPITAL_COMMUNITY): Payer: Self-pay | Admitting: Emergency Medicine

## 2019-07-28 ENCOUNTER — Emergency Department (HOSPITAL_COMMUNITY): Payer: Managed Care, Other (non HMO)

## 2019-07-28 ENCOUNTER — Other Ambulatory Visit: Payer: Self-pay

## 2019-07-28 ENCOUNTER — Emergency Department (HOSPITAL_COMMUNITY)
Admission: EM | Admit: 2019-07-28 | Discharge: 2019-07-28 | Disposition: A | Discharge status: Home / Self Care | Payer: Managed Care, Other (non HMO) | Attending: Emergency Medicine | Admitting: Emergency Medicine

## 2019-07-28 DIAGNOSIS — R531 Weakness: Secondary | ICD-10-CM

## 2019-07-28 DIAGNOSIS — M791 Myalgia, unspecified site: Secondary | ICD-10-CM

## 2019-07-28 LAB — BASIC METABOLIC PANEL
Anion gap: 12 (ref 5–15)
BUN: 15 mg/dL (ref 6–20)
CO2: 27 mmol/L (ref 22–32)
Calcium: 8.4 mg/dL — ABNORMAL LOW (ref 8.9–10.3)
Chloride: 100 mmol/L (ref 98–111)
Creatinine, Ser: 1.16 mg/dL (ref 0.61–1.24)
GFR calc Af Amer: >60 mL/min (ref >60)
GFR calc non Af Amer: >60 mL/min (ref >60)
Glucose, Bld: 112 mg/dL — ABNORMAL HIGH (ref 70–99)
Potassium: 3.8 mmol/L (ref 3.5–5.1)
Sodium: 139 mmol/L (ref 135–145)

## 2019-07-28 LAB — CBC
HCT: 37.5 % — ABNORMAL LOW (ref 39.0–52.0)
Hemoglobin: 12 g/dL — ABNORMAL LOW (ref 13.0–17.0)
MCH: 31.3 pg (ref 26.0–34.0)
MCHC: 32 g/dL (ref 30.0–36.0)
MCV: 97.7 fL (ref 80.0–100.0)
Platelets: 198 10*3/uL (ref 150–400)
RBC: 3.84 MIL/uL — ABNORMAL LOW (ref 4.22–5.81)
RDW: 11.5 % (ref 11.5–15.5)
WBC: 3.3 10*3/uL — ABNORMAL LOW (ref 4.0–10.5)
nRBC: 0 % (ref 0.0–0.2)

## 2019-07-28 LAB — NOVEL CORONAVIRUS, NAA: SARS-CoV-2, NAA: DETECTED — AB

## 2019-07-28 MED ORDER — ACETAMINOPHEN 325 MG PO TABS
650.0000 mg | ORAL_TABLET | Freq: Once | ORAL | Status: AC
  Administered 2019-07-28: 650 mg via ORAL
  Filled 2019-07-28: qty 2

## 2019-07-28 NOTE — ED Notes (Signed)
Patient provided with a urinal to provide urine sample.
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
## 2019-07-28 NOTE — ED Triage Notes (Signed)
Patient here from home with complaints of back pain, headache, and fatigue since Wednesday. Reports that PCP gave him antibiotics with no relief. COVID test done yesterday.

## 2019-07-29 LAB — NOVEL CORONAVIRUS, NAA

## 2019-08-01 ENCOUNTER — Telehealth: Payer: Self-pay | Admitting: *Deleted

## 2019-08-01 NOTE — Telephone Encounter (Signed)
Patient tested positive for covid - 19 has questions , please call him at [phone]

## 2019-08-01 NOTE — Telephone Encounter (Signed)
Patient calling to see when he could return to work. Pt was tested on 07/26/19 and had an exposure at work. Reviewed isolation guidelines and criteria for ending self-isolation with the patient. Understanding verbalized.

## 2019-12-13 ENCOUNTER — Emergency Department (HOSPITAL_COMMUNITY): Payer: Managed Care, Other (non HMO) | Admitting: Certified Registered Nurse Anesthetist

## 2019-12-13 ENCOUNTER — Other Ambulatory Visit: Payer: Self-pay

## 2019-12-13 ENCOUNTER — Emergency Department (HOSPITAL_COMMUNITY)
Admission: EM | Admit: 2019-12-13 | Discharge: 2019-12-14 | Disposition: A | Discharge status: Home / Self Care | Payer: Managed Care, Other (non HMO) | Attending: Gastroenterology | Admitting: Gastroenterology

## 2019-12-13 ENCOUNTER — Encounter (HOSPITAL_COMMUNITY): Payer: Self-pay | Admitting: Certified Registered Nurse Anesthetist

## 2019-12-13 ENCOUNTER — Encounter (HOSPITAL_COMMUNITY)
Admission: EM | Disposition: A | Discharge status: Home / Self Care | Payer: Self-pay | Source: Home / Self Care | Attending: Emergency Medicine

## 2019-12-13 DIAGNOSIS — Z8249 Family history of ischemic heart disease and other diseases of the circulatory system: Secondary | ICD-10-CM | POA: Insufficient documentation

## 2019-12-13 DIAGNOSIS — Z87891 Personal history of nicotine dependence: Secondary | ICD-10-CM | POA: Diagnosis not present

## 2019-12-13 DIAGNOSIS — Y998 Other external cause status: Secondary | ICD-10-CM | POA: Insufficient documentation

## 2019-12-13 DIAGNOSIS — Z809 Family history of malignant neoplasm, unspecified: Secondary | ICD-10-CM | POA: Insufficient documentation

## 2019-12-13 DIAGNOSIS — J45909 Unspecified asthma, uncomplicated: Secondary | ICD-10-CM | POA: Diagnosis not present

## 2019-12-13 DIAGNOSIS — K222 Esophageal obstruction: Secondary | ICD-10-CM

## 2019-12-13 DIAGNOSIS — G8389 Other specified paralytic syndromes: Secondary | ICD-10-CM | POA: Diagnosis not present

## 2019-12-13 DIAGNOSIS — U071 COVID-19: Secondary | ICD-10-CM

## 2019-12-13 DIAGNOSIS — Y939 Activity, unspecified: Secondary | ICD-10-CM | POA: Diagnosis not present

## 2019-12-13 DIAGNOSIS — Z20822 Contact with and (suspected) exposure to covid-19: Secondary | ICD-10-CM | POA: Insufficient documentation

## 2019-12-13 DIAGNOSIS — I708 Atherosclerosis of other arteries: Secondary | ICD-10-CM | POA: Insufficient documentation

## 2019-12-13 DIAGNOSIS — Z800 Family history of malignant neoplasm of digestive organs: Secondary | ICD-10-CM | POA: Diagnosis not present

## 2019-12-13 DIAGNOSIS — G7102 Facioscapulohumeral muscular dystrophy: Secondary | ICD-10-CM | POA: Diagnosis not present

## 2019-12-13 DIAGNOSIS — K219 Gastro-esophageal reflux disease without esophagitis: Secondary | ICD-10-CM

## 2019-12-13 DIAGNOSIS — K449 Diaphragmatic hernia without obstruction or gangrene: Secondary | ICD-10-CM | POA: Diagnosis not present

## 2019-12-13 DIAGNOSIS — Y929 Unspecified place or not applicable: Secondary | ICD-10-CM | POA: Insufficient documentation

## 2019-12-13 DIAGNOSIS — T18128A Food in esophagus causing other injury, initial encounter: Secondary | ICD-10-CM | POA: Diagnosis not present

## 2019-12-13 DIAGNOSIS — Z7982 Long term (current) use of aspirin: Secondary | ICD-10-CM | POA: Insufficient documentation

## 2019-12-13 DIAGNOSIS — Y9389 Activity, other specified: Secondary | ICD-10-CM | POA: Insufficient documentation

## 2019-12-13 DIAGNOSIS — Z79899 Other long term (current) drug therapy: Secondary | ICD-10-CM | POA: Insufficient documentation

## 2019-12-13 DIAGNOSIS — I10 Essential (primary) hypertension: Secondary | ICD-10-CM | POA: Diagnosis not present

## 2019-12-13 DIAGNOSIS — X58XXXA Exposure to other specified factors, initial encounter: Secondary | ICD-10-CM | POA: Diagnosis not present

## 2019-12-13 HISTORY — PX: FOREIGN BODY REMOVAL: SHX962

## 2019-12-13 HISTORY — PX: ESOPHAGOGASTRODUODENOSCOPY (EGD) WITH PROPOFOL: SHX5813

## 2019-12-13 LAB — CBC
HCT: 40.8 % (ref 39.0–52.0)
Hemoglobin: 13.2 g/dL (ref 13.0–17.0)
MCH: 32 pg (ref 26.0–34.0)
MCHC: 32.4 g/dL (ref 30.0–36.0)
MCV: 99 fL (ref 80.0–100.0)
Platelets: 252 10*3/uL (ref 150–400)
RBC: 4.12 MIL/uL — ABNORMAL LOW (ref 4.22–5.81)
RDW: 12 % (ref 11.5–15.5)
WBC: 5.3 10*3/uL (ref 4.0–10.5)
nRBC: 0 % (ref 0.0–0.2)

## 2019-12-13 LAB — BASIC METABOLIC PANEL
Anion gap: 6 (ref 5–15)
BUN: 13 mg/dL (ref 6–20)
CO2: 30 mmol/L (ref 22–32)
Calcium: 9.8 mg/dL (ref 8.9–10.3)
Chloride: 107 mmol/L (ref 98–111)
Creatinine, Ser: 1.17 mg/dL (ref 0.61–1.24)
GFR calc Af Amer: >60 mL/min (ref >60)
GFR calc non Af Amer: >60 mL/min (ref >60)
Glucose, Bld: 171 mg/dL — ABNORMAL HIGH (ref 70–99)
Potassium: 3.8 mmol/L (ref 3.5–5.1)
Sodium: 143 mmol/L (ref 135–145)

## 2019-12-13 LAB — RESPIRATORY PANEL BY RT PCR (FLU A&B, COVID)
Influenza A by PCR: NEGATIVE
Influenza B by PCR: NEGATIVE
SARS Coronavirus 2 by RT PCR: POSITIVE — AB

## 2019-12-13 SURGERY — ESOPHAGOGASTRODUODENOSCOPY (EGD) WITH PROPOFOL
Anesthesia: General

## 2019-12-13 MED ORDER — PROPOFOL 500 MG/50ML IV EMUL
INTRAVENOUS | Status: AC
  Filled 2019-12-13: qty 50

## 2019-12-13 MED ORDER — PROPOFOL 10 MG/ML IV BOLUS
INTRAVENOUS | Status: DC | PRN
  Administered 2019-12-13: 200 mg via INTRAVENOUS
  Administered 2019-12-13: 100 mg via INTRAVENOUS

## 2019-12-13 MED ORDER — LIDOCAINE 2% (20 MG/ML) 5 ML SYRINGE
INTRAMUSCULAR | Status: DC | PRN
  Administered 2019-12-13: 100 mg via INTRAVENOUS

## 2019-12-13 MED ORDER — FENTANYL CITRATE (PF) 100 MCG/2ML IJ SOLN
INTRAMUSCULAR | Status: AC
  Filled 2019-12-13: qty 2

## 2019-12-13 MED ORDER — NITROGLYCERIN 0.4 MG SL SUBL
0.4000 mg | SUBLINGUAL_TABLET | Freq: Once | SUBLINGUAL | Status: AC
  Administered 2019-12-13: 0.4 mg via SUBLINGUAL
  Filled 2019-12-13: qty 1

## 2019-12-13 MED ORDER — GLUCAGON HCL RDNA (DIAGNOSTIC) 1 MG IJ SOLR
1.0000 mg | Freq: Once | INTRAMUSCULAR | Status: AC
  Administered 2019-12-13: 1 mg via INTRAMUSCULAR
  Filled 2019-12-13: qty 1

## 2019-12-13 MED ORDER — LACTATED RINGERS IV SOLN: INTRAVENOUS | Status: DC | PRN

## 2019-12-13 MED ORDER — DEXAMETHASONE SODIUM PHOSPHATE 10 MG/ML IJ SOLN
INTRAMUSCULAR | Status: DC | PRN
  Administered 2019-12-13: 10 mg via INTRAVENOUS

## 2019-12-13 MED ORDER — ONDANSETRON HCL 4 MG/2ML IJ SOLN
8.0000 mg | Freq: Once | INTRAMUSCULAR | Status: AC
  Administered 2019-12-13: 8 mg via INTRAMUSCULAR
  Filled 2019-12-13: qty 4

## 2019-12-13 MED ORDER — SUCCINYLCHOLINE CHLORIDE 200 MG/10ML IV SOSY
PREFILLED_SYRINGE | INTRAVENOUS | Status: DC | PRN
  Administered 2019-12-13: 160 mg via INTRAVENOUS

## 2019-12-13 MED ORDER — FENTANYL CITRATE (PF) 100 MCG/2ML IJ SOLN
INTRAMUSCULAR | Status: DC | PRN
  Administered 2019-12-13 (×2): 50 ug via INTRAVENOUS

## 2019-12-13 MED ORDER — ONDANSETRON HCL 4 MG/2ML IJ SOLN
INTRAMUSCULAR | Status: DC | PRN
  Administered 2019-12-13: 4 mg via INTRAVENOUS

## 2019-12-13 SURGICAL SUPPLY — 15 items

## 2019-12-13 NOTE — Anesthesia Preprocedure Evaluation (Signed)
Anesthesia Evaluation  Patient identified by MRN, date of birth, ID band Patient awake    Reviewed: Allergy & Precautions, H&P , NPO status , Patient's Chart, lab work & pertinent test results, reviewed documented beta blocker date and time   Airway Mallampati: II  TM Distance: >3 FB Neck ROM: full    Dental no notable dental hx.    Pulmonary asthma , former smoker,    Pulmonary exam normal breath sounds clear to auscultation       Cardiovascular Exercise Tolerance: Good hypertension, Pt. on home beta blockers and Pt. on medications negative cardio ROS   Rhythm:regular Rate:Normal     Neuro/Psych  Neuromuscular disease negative psych ROS   GI/Hepatic negative GI ROS, Neg liver ROS, GERD  ,  Endo/Other  negative endocrine ROS  Renal/GU negative Renal ROS  negative genitourinary   Musculoskeletal negative musculoskeletal ROS (+)   Abdominal   Peds negative pediatric ROS (+)  Hematology negative hematology ROS (+)   Anesthesia Other Findings   Reproductive/Obstetrics negative OB ROS                             Anesthesia Physical Anesthesia Plan  ASA: III and emergent  Anesthesia Plan: General   Post-op Pain Management:    Induction:   PONV Risk Score and Plan: 2 and Treatment may vary due to age or medical condition  Airway Management Planned: Oral ETT  Additional Equipment:   Intra-op Plan:   Post-operative Plan:   Informed Consent: I have reviewed the patients History and Physical, chart, labs and discussed the procedure including the risks, benefits and alternatives for the proposed anesthesia with the patient or authorized representative who has indicated his/her understanding and acceptance.     Dental Advisory Given  Plan Discussed with: CRNA, Anesthesiologist and Surgeon  Anesthesia Plan Comments:         Anesthesia Quick Evaluation

## 2019-12-13 NOTE — Transfer of Care (Signed)
Immediate Anesthesia Transfer of Care Note  Patient: Kyle Donovan  Procedure(s) Performed: ESOPHAGOGASTRODUODENOSCOPY (EGD) WITH PROPOFOL (N/A ) FOREIGN BODY REMOVAL  Patient Location: Endoscopy Unit  Anesthesia Type:General  Level of Consciousness: awake, alert , oriented and patient cooperative  Airway & Oxygen Therapy: Patient Spontanous Breathing and Patient connected to face mask  Post-op Assessment: Report given to RN and Post -op Vital signs reviewed and stable  Post vital signs: Reviewed and stable  Last Vitals:  Vitals Value Taken Time  BP 188/104 12/13/19 2138  Temp    Pulse 77 12/13/19 2138  Resp 18 12/13/19 2138  SpO2 99 % 12/13/19 2138    Last Pain:  Vitals:   12/13/19 2138  TempSrc: Axillary  PainSc: 0-No pain         Complications: No apparent anesthesia complications

## 2019-12-13 NOTE — Op Note (Signed)
[facility] Patient Name: Kyle Donovan Procedure Date: 12/13/2019 MRN: [id number] Attending MD: Jackquline Denmark , MD Date of Birth: [birth year] CSN: [id number] Age: 49 Admit Type: Outpatient Procedure:                Upper GI endoscopy Indications:              Foreign body in the esophagus, COVID-19 positive. Providers:                Jackquline Denmark, MD, Benay Pillow, RN, Elspeth Cho                            Tech., Technician, Dellie Catholic Referring MD:              Medicines:                General Anesthesia Complications:            No immediate complications. Estimated Blood Loss:     Estimated blood loss: none. Procedure:                Pre-Anesthesia Assessment:                           - Prior to the procedure, a History and Physical                            was performed, and patient medications and                            allergies were reviewed. The patient's tolerance of                            previous anesthesia was also reviewed. The risks                            and benefits of the procedure and the sedation                            options and risks were discussed with the patient.                            All questions were answered, and informed consent                            was obtained. Prior Anticoagulants: The patient has                            taken no previous anticoagulant or antiplatelet                            agents. ASA Grade Assessment: III - A patient with                            severe systemic disease. After reviewing the risks  and benefits, the patient was deemed in                            satisfactory condition to undergo the procedure.                           After obtaining informed consent, the endoscope was                            passed under direct vision. Throughout the                            procedure, the patient's blood pressure, pulse, and                    oxygen saturations were monitored continuously. The                            [id number]-H190 ([id number]) Olympus gastroscope was                            introduced through the mouth, and advanced to the                            second part of duodenum. The upper GI endoscopy was                            accomplished without difficulty. The patient                            tolerated the procedure well. Scope In: Scope Out: Findings:      Food impaction distal esophagus. The snare went right through the food.       The scope was gently passed around the food impaction into the stomach       with resultant food passing into the stomach. There was underlying       stricture at the GE junction with moderate surrounding esophagitis.      A small transient hiatal hernia was present.      The entire examined stomach was normal. There was some retained food in       the stomach.      The examined duodenum was normal. Impression:               - Food impaction distal esophagus s/p endoscopic                            disimpaction.                           - Benign-appearing esophageal stenosis.                           - Small hiatal hernia. Moderate Sedation:      Not Applicable - Patient had care per Anesthesia. Recommendation:           - Patient has a contact number available for  emergencies. The signs and symptoms of potential                            delayed complications were discussed with the                            patient. Return to normal activities tomorrow.                            Written discharge instructions were provided to the                            patient.                           - Soft diet today. Then please chew food specially                            meats and breads well and eat slowly.                           - Use Prilosec (omeprazole) 40 mg PO BID.                           - Return to GI clinic in  2-3 weeks with Dr Russella Dar.                            He would require esophageal dilatation as an                            outpatient as before. Procedure Code(s):        --- Professional ---                           [phone], Esophagogastroduodenoscopy, flexible,                            transoral; with removal of foreign body(s) Diagnosis Code(s):        --- Professional ---                           S28.315V, Food in esophagus causing other injury,                            initial encounter                           K22.2, Esophageal obstruction                           K44.9, Diaphragmatic hernia without obstruction or                            gangrene  T18.108A, Unspecified foreign body in esophagus                            causing other injury, initial encounter CPT copyright 2019 [employer]. All rights reserved. The codes documented in this report are preliminary and upon coder review may  be revised to meet current compliance requirements. Lynann Bologna, MD 12/13/2019 9:45:50 PM This report has been signed electronically. Number of Addenda: 0

## 2019-12-13 NOTE — Anesthesia Procedure Notes (Signed)
Procedure Name: Intubation Date/Time: 12/13/2019 9:17 PM Performed by: Vanessa [place], CRNA Pre-anesthesia Checklist: Patient identified, Emergency Drugs available, Suction available and Patient being monitored Patient Re-evaluated:Patient Re-evaluated prior to induction Oxygen Delivery Method: Circle system utilized Preoxygenation: Pre-oxygenation with 100% oxygen Induction Type: IV induction, Rapid sequence and Cricoid Pressure applied Laryngoscope Size: 2 and Miller Grade View: Grade I Tube type: Oral Tube size: 7.5 mm Number of attempts: 1 Airway Equipment and Method: Stylet Placement Confirmation: ETT inserted through vocal cords under direct vision,  positive ETCO2 and breath sounds checked- equal and bilateral Tube secured with: Tape Dental Injury: Teeth and Oropharynx as per pre-operative assessment

## 2019-12-13 NOTE — ED Notes (Signed)
Date and time results received: 12/13/19 8:17 PM  Test: Covid Test  Critical Value: Positive  Name of Provider Notified: Rhunette Croft, MD  Orders Received? Or Actions Taken?:

## 2019-12-13 NOTE — Discharge Instructions (Addendum)
Please call the GI doctors for follow-up in 2 weeks.  Start taking the omeprazole.  You are also diagnosed with COVID-19. Take the medication as prescribed.
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
## 2019-12-13 NOTE — ED Triage Notes (Signed)
Pt reports eating hot dog appx 1545 when it got stuck.  Pt reports trying to cough and vomit it out, but its lodged.  Unable to swallow water. Pt speaking in full sentences.

## 2019-12-14 MED ORDER — OMEPRAZOLE 40 MG PO CPDR: 40.0000 mg | DELAYED_RELEASE_CAPSULE | Freq: Two times a day (BID) | ORAL | 0 refills | Status: DC

## 2019-12-14 NOTE — ED Notes (Signed)
Pt up walking around the room. Pt ready for d/c. Urinal used. NAD noted.

## 2019-12-14 NOTE — ED Notes (Signed)
Pt lying in bed awake. Pt heading to endoscopy. NAD noted.

## 2019-12-15 NOTE — Anesthesia Postprocedure Evaluation (Signed)
Anesthesia Post Note  Patient: Kyle Donovan  Procedure(s) Performed: ESOPHAGOGASTRODUODENOSCOPY (EGD) WITH PROPOFOL (N/A ) FOREIGN BODY REMOVAL     Patient location during evaluation: PACU Anesthesia Type: General Level of consciousness: awake and alert Pain management: pain level controlled Vital Signs Assessment: post-procedure vital signs reviewed and stable Respiratory status: spontaneous breathing, nonlabored ventilation, respiratory function stable and patient connected to nasal cannula oxygen Cardiovascular status: blood pressure returned to baseline and stable Postop Assessment: no apparent nausea or vomiting Anesthetic complications: no    Last Vitals:  Vitals:   12/14/19 0039 12/14/19 0042  BP: (!) 151/100   Pulse:  82  Resp: 18   Temp: 36.9 C   SpO2:  96%    Last Pain:  Vitals:   12/14/19 0045  TempSrc:   PainSc: 4                  Mitra Duling

## 2019-12-16 ENCOUNTER — Telehealth: Payer: Self-pay

## 2019-12-16 ENCOUNTER — Encounter: Payer: Self-pay | Admitting: *Deleted

## 2019-12-16 NOTE — Telephone Encounter (Signed)
-----   Message from Meryl Dare, MD sent at 12/14/2019  8:43 AM EDT ----- Regarding: FW: Please schedule EGD/dilation as below. Soft foods only (no meat, bread) until dilation. Thx. ----- Message ----- From: Lynann Bologna, MD Sent: 12/13/2019   9:50 PM EDT To: Meryl Dare, MD  Kyle Donovan,  49yr old with food impaction/pos Covid-19 Did endoscopic disimpaction tonight  Would need FU EGD with dil in 3-4 weeks.  Covid will be better by then as well  Thx  Kyle Donovan

## 2019-12-16 NOTE — Telephone Encounter (Signed)
Left message for patient to call back  

## 2019-12-18 ENCOUNTER — Telehealth: Payer: Self-pay | Admitting: Internal Medicine

## 2019-12-18 ENCOUNTER — Encounter: Payer: Self-pay | Admitting: Internal Medicine

## 2019-12-18 DIAGNOSIS — I10 Essential (primary) hypertension: Secondary | ICD-10-CM

## 2019-12-18 NOTE — Telephone Encounter (Signed)
Per note from Hezzie Bump: Pt called back to change appointment and Can you please send over a good rx card. Pt states his other card has expired. Pt has tested positive for covid but is currently out of this medication. He would like at least enough to get him to his next appt.

## 2019-12-18 NOTE — Addendum Note (Signed)
Addended by: Verlan Friends on: 12/18/2019 04:27 PM   Modules accepted: Orders

## 2019-12-18 NOTE — Telephone Encounter (Signed)
    Pt c/o medication issue:  1. Name of Medication: BYSTOLIC 5 MG tablet  2. How are you currently taking this medication (dosage and times per day)? As written  3. Are you having a reaction (difficulty breathing--STAT)? no  4. What is your medication issue? Patient requesting  coupon/ discount card

## 2019-12-18 NOTE — Telephone Encounter (Signed)
New message:   1.Medication Requested: BYSTOLIC 5 MG tablet 2. Pharmacy (Name, Street, [place]): [facility] [phone] - [place], [place] - [address] AT [facility] OF [address] 3. On Med List: yes  4. Last Visit with PCP:   5. Next visit date with PCP: 01/06/20  Can you please send over a good rx card. Pt states his other card has expired. Pt has tested positive for covid but is currently out of this medication. He would like at least enough to get him to his next appt. Agent: Please be advised that RX refills may take up to 3 business days. We ask that you follow-up with your pharmacy.

## 2019-12-18 NOTE — Telephone Encounter (Signed)
Patient was to follow up in October. Can you call patient and have the schedule an appointment.

## 2019-12-18 NOTE — Telephone Encounter (Signed)
Duplicate Note. Closing.

## 2019-12-18 NOTE — Telephone Encounter (Signed)
Patient scheduled for 12/19/2019 at 3:00pm.

## 2019-12-18 NOTE — Telephone Encounter (Signed)
LVM for pt informing that the GoodRx participates differently with each pharmacy. I have sent my chart message with the GoodRx information that the pharmacy will need.   We can send in a 30 day to whichever pharmacy the patient would like [place] to send it to. I have pended the medication to assist in getting this sent in for a 30 day supply only.

## 2019-12-19 ENCOUNTER — Ambulatory Visit: Payer: Managed Care, Other (non HMO) | Admitting: Internal Medicine

## 2020-01-06 ENCOUNTER — Encounter: Payer: Self-pay | Admitting: Internal Medicine

## 2020-01-06 ENCOUNTER — Other Ambulatory Visit: Payer: Self-pay

## 2020-01-06 ENCOUNTER — Ambulatory Visit (INDEPENDENT_AMBULATORY_CARE_PROVIDER_SITE_OTHER): Payer: Managed Care, Other (non HMO) | Admitting: Internal Medicine

## 2020-01-06 VITALS — BP 136/92 | HR 69 | Temp 98.3°F | Resp 16 | Ht 67.0 in | Wt 229.0 lb

## 2020-01-06 DIAGNOSIS — I10 Essential (primary) hypertension: Secondary | ICD-10-CM | POA: Diagnosis not present

## 2020-01-06 DIAGNOSIS — I771 Stricture of artery: Secondary | ICD-10-CM | POA: Diagnosis not present

## 2020-01-06 DIAGNOSIS — R3129 Other microscopic hematuria: Secondary | ICD-10-CM

## 2020-01-06 DIAGNOSIS — R739 Hyperglycemia, unspecified: Secondary | ICD-10-CM

## 2020-01-06 DIAGNOSIS — Z0000 Encounter for general adult medical examination without abnormal findings: Secondary | ICD-10-CM | POA: Diagnosis not present

## 2020-01-06 LAB — BASIC METABOLIC PANEL
BUN: 12 mg/dL (ref 6–23)
CO2: 31 mEq/L (ref 19–32)
Calcium: 9.6 mg/dL (ref 8.4–10.5)
Chloride: 104 mEq/L (ref 96–112)
Creatinine, Ser: 1.17 mg/dL (ref 0.40–1.50)
GFR: 80.49 mL/min (ref >60.00)
Glucose, Bld: 97 mg/dL (ref 70–99)
Potassium: 3.7 mEq/L (ref 3.5–5.1)
Sodium: 140 mEq/L (ref 135–145)

## 2020-01-06 LAB — LIPID PANEL
Cholesterol: 175 mg/dL (ref 0–200)
HDL: 73.1 mg/dL (ref >39.00)
LDL Cholesterol: 86 mg/dL (ref 0–99)
NonHDL: 101.71
Total CHOL/HDL Ratio: 2
Triglycerides: 78 mg/dL (ref 0.0–149.0)
VLDL: 15.6 mg/dL (ref 0.0–40.0)

## 2020-01-06 LAB — HEMOGLOBIN A1C: Hgb A1c MFr Bld: 4.9 % (ref 4.6–6.5)

## 2020-01-06 MED ORDER — NEBIVOLOL HCL 10 MG PO TABS: 10.0000 mg | ORAL_TABLET | Freq: Every day | ORAL | 1 refills | Status: DC

## 2020-01-06 NOTE — Progress Notes (Signed)
Subjective:  Patient ID: Kyle Donovan, male    DOB: [birth year]  Age: 49 y.o. MRN: [id number]  CC: Hypertension and Annual Exam  This visit occurred during the SARS-CoV-2 public health emergency.  Safety protocols were in place, including screening questions prior to the visit, additional usage of staff PPE, and extensive cleaning of exam room while observing appropriate contact time as indicated for disinfecting solutions.    HPI Kyle Donovan presents for a CPX.    He recovered from a recent food impaction and infection with COVID-19.  He denies any recent trouble swallowing, cough, chest pain, shortness of breath, abdominal pain, fatigue, fever, or chills.  He is concerned that his blood pressure is not adequately well controlled.  #1.  Food impaction  #2.  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 H/O food impaction s/p endoscopic disimpaction 07/2014  #3.  Covid 19 positive  Plan: -Emergent EGD with all Covid precautions.  Discussed risks and benefits including small but definite risks of esophageal perforation requiring thoracotomy, bleeding, aspiration.  Alternatives were also given.  He wishes to proceed. -Thereafter he needs to be omeprazole 40 mg p.o. twice daily -Also would need to FU with Dr. Russella Dar for EGD with dil/colon for colorectal cancer screening.  Outpatient Medications Prior to Visit  Medication Sig Dispense Refill  . BYSTOLIC 5 MG tablet TAKE 1 TABLET BY MOUTH EVERY DAY 30 tablet 2  . albuterol (VENTOLIN HFA) 108 (90 Base) MCG/ACT inhaler Inhale 2 puffs into the lungs every 6 (six) hours as needed for wheezing or shortness of breath.    Marland Kitchen acetaminophen (TYLENOL) 500 MG tablet Take 500 mg by mouth every 6 (six) hours as needed for moderate pain.    . Albuterol Sulfate (PROAIR RESPICLICK) 108 (90 Base) MCG/ACT AEPB Inhale 1 Act into the lungs 4 (four) times daily as needed. (Patient not taking: Reported on  07/28/2019) 1 each 11  . aspirin EC 81 MG tablet Take 81 mg by mouth daily as needed for moderate pain.    Marland Kitchen azithromycin (ZITHROMAX) 250 MG tablet 2 tab by mouth day 1, then 1 per day (Patient not taking: Reported on 07/28/2019) 6 tablet 0  . omeprazole (PRILOSEC) 40 MG capsule Take 1 capsule (40 mg total) by mouth 2 (two) times daily before a meal. 60 capsule 0   No facility-administered medications prior to visit.    ROS Review of Systems  Constitutional: Negative.  Negative for appetite change, diaphoresis, fatigue and unexpected weight change.  HENT: Negative.  Negative for trouble swallowing.   Eyes: Negative.   Respiratory: Negative for cough, chest tightness, shortness of breath and wheezing.   Cardiovascular: Negative for chest pain, palpitations and leg swelling.  Gastrointestinal: Negative for abdominal pain, constipation, diarrhea, nausea and vomiting.  Endocrine: Negative.   Genitourinary: Negative.  Negative for difficulty urinating, dysuria, hematuria, scrotal swelling, testicular pain and urgency.  Musculoskeletal: Negative.  Negative for arthralgias and myalgias.  Skin: Negative.   Neurological: Negative.  Negative for dizziness, weakness, light-headedness and headaches.  Hematological: Negative.  Negative for adenopathy. Does not bruise/bleed easily.  Psychiatric/Behavioral: Negative.     Objective:  BP (!) 136/92 (BP Location: Left Arm, Patient Position: Sitting, Cuff Size: Normal)   Pulse 69   Temp 98.3 F (36.8 C) (Oral)   Resp 16   Ht 5\' 7"  (1.702 m)   Wt 229 lb (103.9 kg)   SpO2 98%   BMI 35.87  kg/m   BP Readings from Last 3 Encounters:  01/06/20 (!) 136/92  12/14/19 (!) 151/100  07/28/19 124/69    Wt Readings from Last 3 Encounters:  01/06/20 229 lb (103.9 kg)  12/13/19 220 lb (99.8 kg)  07/28/19 210 lb (95.3 kg)    Physical Exam Vitals reviewed. Exam conducted with a chaperone present.  Constitutional:      Appearance: Normal appearance.    HENT:     Nose: Nose normal.     Mouth/Throat:     Mouth: Mucous membranes are moist.  Eyes:     General: No scleral icterus.    Conjunctiva/sclera: Conjunctivae normal.  Cardiovascular:     Rate and Rhythm: Normal rate and regular rhythm.     Pulses: Normal pulses.     Heart sounds: No murmur.  Pulmonary:     Effort: Pulmonary effort is normal.     Breath sounds: No stridor. No wheezing, rhonchi or rales.  Abdominal:     General: Abdomen is flat. Bowel sounds are normal. There is no distension.     Palpations: Abdomen is soft. There is no hepatomegaly, splenomegaly or mass.     Tenderness: There is no abdominal tenderness.     Hernia: No hernia is present. There is no hernia in the left inguinal area or right inguinal area.  Genitourinary:    Pubic Area: No rash.      Penis: Normal and circumcised. No swelling or lesions.      Testes: Normal.        Right: Mass not present.        Left: Mass not present.     Epididymis:     Right: Normal. Not inflamed or enlarged. No mass.     Left: Normal. Not inflamed or enlarged. No mass.     Prostate: Normal. Not enlarged, not tender and no nodules present.     Rectum: Normal. Guaiac result negative. No mass, tenderness, anal fissure, external hemorrhoid or internal hemorrhoid. Normal anal tone.  Musculoskeletal:        General: Normal range of motion.     Cervical back: Neck supple.     Right lower leg: No edema.     Left lower leg: No edema.  Lymphadenopathy:     Cervical: No cervical adenopathy.     Lower Body: No right inguinal adenopathy. No left inguinal adenopathy.  Skin:    General: Skin is warm and dry.     Coloration: Skin is not pale.  Neurological:     General: No focal deficit present.     Mental Status: He is alert and oriented to person, place, and time. Mental status is at baseline.  Psychiatric:        Mood and Affect: Mood normal.        Behavior: Behavior normal.     Lab Results  Component Value Date   WBC  5.3 12/13/2019   HGB 13.2 12/13/2019   HCT 40.8 12/13/2019   PLT 252 12/13/2019   GLUCOSE 97 01/06/2020   CHOL 175 01/06/2020   TRIG 78.0 01/06/2020   HDL 73.10 01/06/2020   LDLCALC 86 01/06/2020   ALT 17 03/14/2018   AST 17 03/14/2018   NA 140 01/06/2020   K 3.7 01/06/2020   CL 104 01/06/2020   CREATININE 1.17 01/06/2020   BUN 12 01/06/2020   CO2 31 01/06/2020   TSH 1.75 03/14/2018   PSA 0.92 01/06/2020   HGBA1C 4.9 01/06/2020    No  results found.  Assessment & Plan:   Kyle Donovan was seen today for hypertension and annual exam.  Diagnoses and all orders for this visit:  Essential hypertension, benign- His blood pressure is not adequately well controlled.  Labs are neg for secondary causes but he does have mild hematuria.  Will increase the dose of nebivolol. -     Basic metabolic panel; Future -     Urinalysis, Routine w reflex microscopic; Future -     nebivolol (BYSTOLIC) 10 MG tablet; Take 1 tablet (10 mg total) by mouth daily. -     Urinalysis, Routine w reflex microscopic -     Basic metabolic panel  Hyperglycemia- His blood sugar is normal now. -     Basic metabolic panel; Future -     Hemoglobin A1c; Future -     Hemoglobin A1c -     Basic metabolic panel  Subclavian artery stenosis, right (HCC)- No complications related to this.  Routine general medical examination at a health care facility- Exam completed, labs reviewed-statin therapy is not indicated, vaccines reviewed and updated, colon cancer screening is schedules, patient education was given. -     Lipid panel; Future -     PSA; Future -     PSA -     Lipid panel  Other microscopic hematuria- He has fewer red cells in his urine this year than he did last year.  I think this is benign, chronic hematuria.  Will attempt to get better control of his blood pressure. -     Urinalysis, Routine w reflex microscopic; Future -     Urinalysis, Routine w reflex microscopic   I have discontinued Otis J.  Merida's Albuterol Sulfate, azithromycin, Bystolic, acetaminophen, aspirin EC, and omeprazole. I am also having him start on nebivolol. Additionally, I am having him maintain his albuterol.  Meds ordered this encounter  Medications  . nebivolol (BYSTOLIC) 10 MG tablet    Sig: Take 1 tablet (10 mg total) by mouth daily.    Dispense:  90 tablet    Refill:  1   In addition to time spent on CPE, I spent 50 minutes in preparing to see the patient by review of recent labs, imaging and procedures, obtaining and reviewing separately obtained history, communicating with the patient and family or caregiver, ordering medications, tests or procedures, and documenting clinical information in the EHR including the differential Dx, treatment, and any further evaluation and other management of 1. Essential hypertension, benign 2. Hyperglycemia 3. Subclavian artery stenosis, right (HCC) 4. Other microscopic hematuria    Follow-up: Return in about 6 months (around 07/08/2020).  Sanda Linger, MD

## 2020-01-06 NOTE — Patient Instructions (Signed)

## 2020-01-07 LAB — URINALYSIS, ROUTINE W REFLEX MICROSCOPIC
Bilirubin Urine: NEGATIVE
Ketones, ur: NEGATIVE
Leukocytes,Ua: NEGATIVE
Nitrite: NEGATIVE
Specific Gravity, Urine: >=1.03 — AB (ref 1.000–1.030)
Total Protein, Urine: NEGATIVE
Urine Glucose: NEGATIVE
Urobilinogen, UA: 0.2 (ref 0.0–1.0)
pH: 5.5 (ref 5.0–8.0)

## 2020-01-07 LAB — PSA: PSA: 0.92 ng/mL (ref 0.10–4.00)

## 2020-01-08 ENCOUNTER — Encounter: Payer: Self-pay | Admitting: Internal Medicine

## 2020-01-09 IMAGING — DX DG CHEST 1V PORT
1 series · 1 of 1 positions shown · non-contrast
Comparison: 02/28/2019

CLINICAL DATA: Cough and fever.

EXAM:
PORTABLE CHEST 1 VIEW

[chest ap]
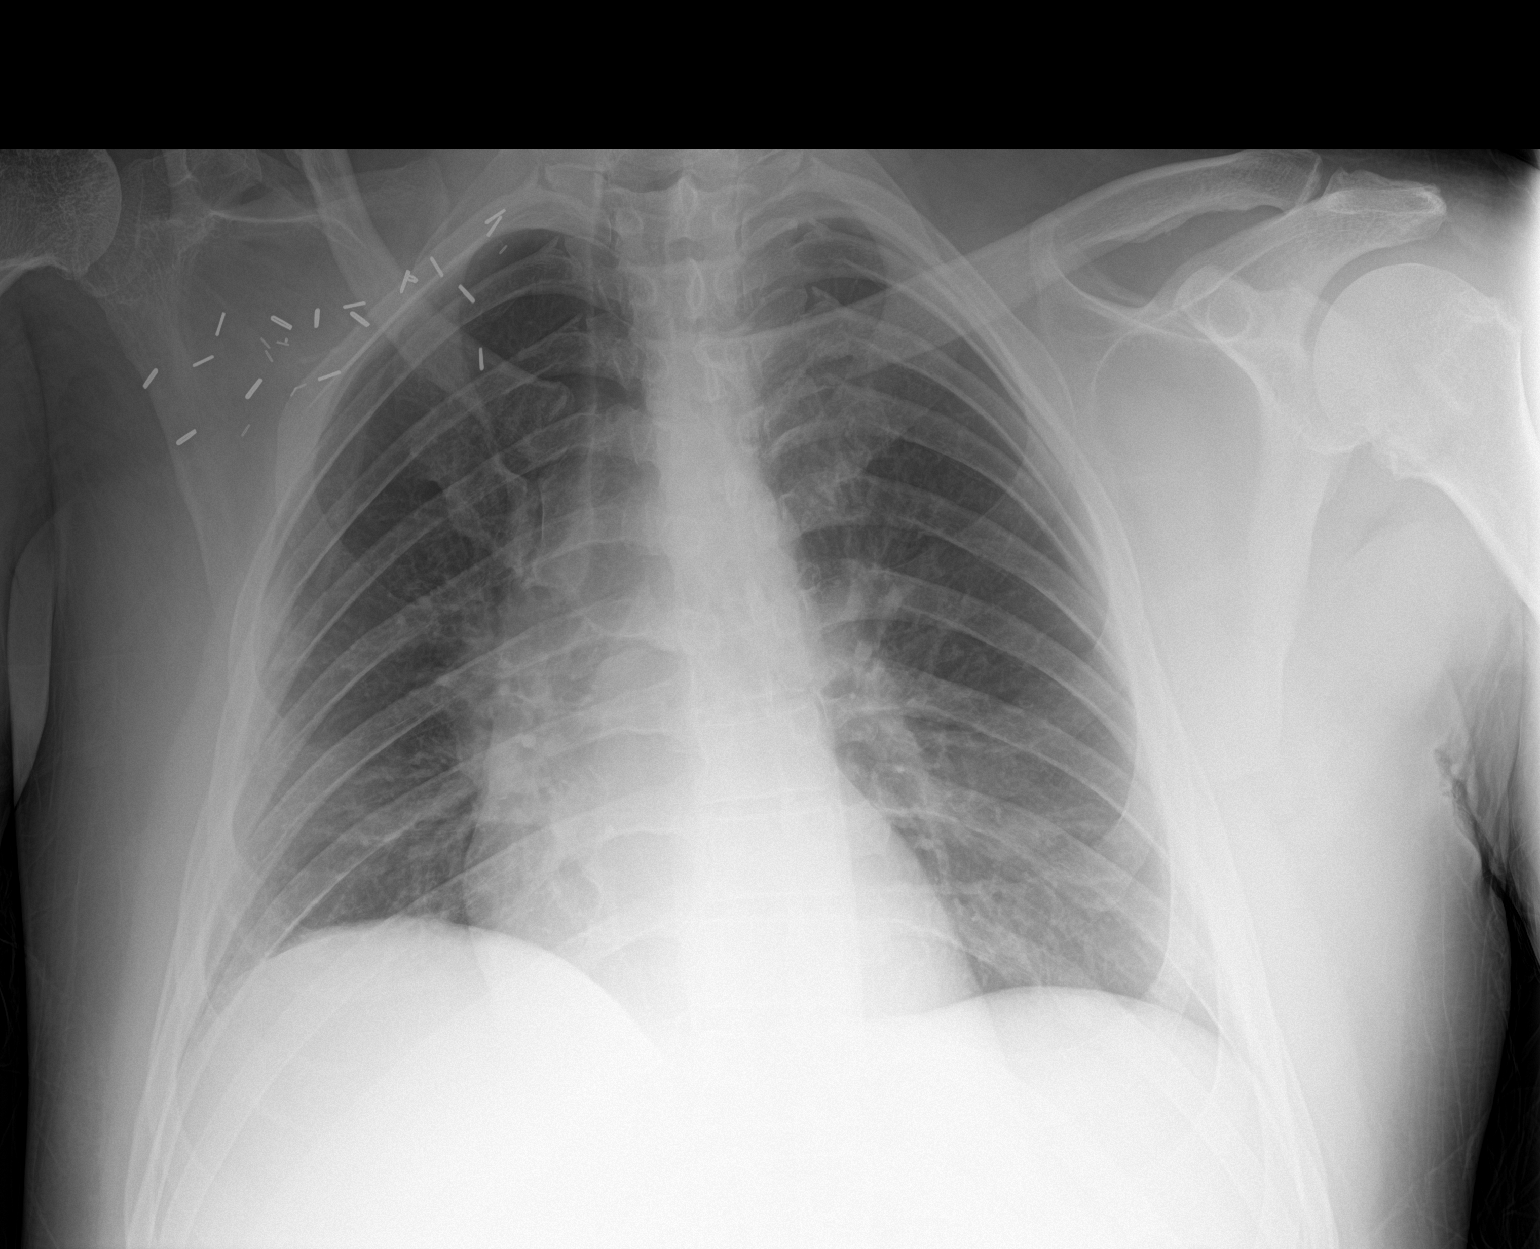

[1 of 1 positions shown; findings below may reference images not displayed]

FINDINGS: The heart size and mediastinal contours are within normal limits.
Both lungs are clear. Surgical clips again seen in the upper right
hemithorax.
IMPRESSION: No active disease.

## 2020-01-14 ENCOUNTER — Other Ambulatory Visit: Payer: Self-pay | Admitting: Internal Medicine

## 2020-01-14 DIAGNOSIS — I10 Essential (primary) hypertension: Secondary | ICD-10-CM

## 2020-06-10 ENCOUNTER — Other Ambulatory Visit: Payer: Self-pay | Admitting: Internal Medicine

## 2020-06-10 ENCOUNTER — Telehealth: Payer: Self-pay | Admitting: Internal Medicine

## 2020-06-10 DIAGNOSIS — I10 Essential (primary) hypertension: Secondary | ICD-10-CM

## 2020-06-10 MED ORDER — NEBIVOLOL HCL 10 MG PO TABS: 10.0000 mg | ORAL_TABLET | Freq: Every day | ORAL | 1 refills | Status: DC

## 2020-06-10 NOTE — Telephone Encounter (Signed)
    Patient requesting refill for nebivolol (BYSTOLIC) 10 MG tablet Pharmacy [id number] - [place], [place] - [address] AT [facility] OF [address] & [address]

## 2020-09-04 ENCOUNTER — Other Ambulatory Visit: Payer: Self-pay | Admitting: Internal Medicine

## 2020-09-04 DIAGNOSIS — I10 Essential (primary) hypertension: Secondary | ICD-10-CM

## 2020-09-12 ENCOUNTER — Other Ambulatory Visit: Payer: Self-pay

## 2020-09-12 ENCOUNTER — Emergency Department (HOSPITAL_COMMUNITY)
Admission: EM | Admit: 2020-09-12 | Discharge: 2020-09-12 | Disposition: A | Discharge status: Home / Self Care | Payer: Managed Care, Other (non HMO) | Attending: Emergency Medicine | Admitting: Emergency Medicine

## 2020-09-12 ENCOUNTER — Encounter (HOSPITAL_COMMUNITY): Payer: Self-pay

## 2020-09-12 DIAGNOSIS — Z87891 Personal history of nicotine dependence: Secondary | ICD-10-CM | POA: Insufficient documentation

## 2020-09-12 DIAGNOSIS — Z79899 Other long term (current) drug therapy: Secondary | ICD-10-CM | POA: Insufficient documentation

## 2020-09-12 DIAGNOSIS — I10 Essential (primary) hypertension: Secondary | ICD-10-CM | POA: Diagnosis not present

## 2020-09-12 DIAGNOSIS — K222 Esophageal obstruction: Secondary | ICD-10-CM

## 2020-09-12 DIAGNOSIS — R0989 Other specified symptoms and signs involving the circulatory and respiratory systems: Secondary | ICD-10-CM | POA: Diagnosis present

## 2020-09-12 DIAGNOSIS — J45909 Unspecified asthma, uncomplicated: Secondary | ICD-10-CM | POA: Insufficient documentation
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
## 2020-09-12 NOTE — Discharge Instructions (Addendum)
Use a liquid diet for the next 24 hours.  Return here for any trouble swallowing

## 2020-09-12 NOTE — ED Triage Notes (Signed)
Patient ate chicken tonight. First bite, it got caught in his throat. This has happened 3 times before. He said they went in and ran a scope down his throat and it came out last time. Patient trying to vomit it up. Patient able to breathe but unable to swallow.

## 2021-04-19 ENCOUNTER — Other Ambulatory Visit: Payer: Self-pay

## 2021-04-19 ENCOUNTER — Emergency Department (HOSPITAL_COMMUNITY)
Admission: EM | Admit: 2021-04-19 | Discharge: 2021-04-20 | Disposition: A | Discharge status: Home / Self Care | Payer: Managed Care, Other (non HMO) | Attending: Emergency Medicine | Admitting: Emergency Medicine

## 2021-04-19 ENCOUNTER — Encounter (HOSPITAL_COMMUNITY): Payer: Self-pay | Admitting: Emergency Medicine

## 2021-04-19 DIAGNOSIS — R1084 Generalized abdominal pain: Secondary | ICD-10-CM | POA: Diagnosis not present

## 2021-04-19 DIAGNOSIS — I10 Essential (primary) hypertension: Secondary | ICD-10-CM | POA: Insufficient documentation

## 2021-04-19 DIAGNOSIS — Z87891 Personal history of nicotine dependence: Secondary | ICD-10-CM | POA: Insufficient documentation

## 2021-04-19 DIAGNOSIS — J45909 Unspecified asthma, uncomplicated: Secondary | ICD-10-CM | POA: Insufficient documentation

## 2021-04-19 DIAGNOSIS — R197 Diarrhea, unspecified: Secondary | ICD-10-CM

## 2021-04-19 LAB — CBC WITH DIFFERENTIAL/PLATELET
Abs Immature Granulocytes: 0.02 10*3/uL (ref 0.00–0.07)
Basophils Absolute: 0 10*3/uL (ref 0.0–0.1)
Basophils Relative: 0 %
Eosinophils Absolute: 0.3 10*3/uL (ref 0.0–0.5)
Eosinophils Relative: 3 %
HCT: 44.2 % (ref 39.0–52.0)
Hemoglobin: 14.5 g/dL (ref 13.0–17.0)
Immature Granulocytes: 0 %
Lymphocytes Relative: 17 %
Lymphs Abs: 1.4 10*3/uL (ref 0.7–4.0)
MCH: 31.5 pg (ref 26.0–34.0)
MCHC: 32.8 g/dL (ref 30.0–36.0)
MCV: 95.9 fL (ref 80.0–100.0)
Monocytes Absolute: 0.3 10*3/uL (ref 0.1–1.0)
Monocytes Relative: 4 %
Neutro Abs: 6.3 10*3/uL (ref 1.7–7.7)
Neutrophils Relative %: 76 %
Platelets: 256 10*3/uL (ref 150–400)
RBC: 4.61 MIL/uL (ref 4.22–5.81)
RDW: 11.9 % (ref 11.5–15.5)
WBC: 8.3 10*3/uL (ref 4.0–10.5)
nRBC: 0 % (ref 0.0–0.2)

## 2021-04-19 LAB — COMPREHENSIVE METABOLIC PANEL
ALT: 23 U/L (ref 0–44)
AST: 21 U/L (ref 15–41)
Albumin: 4.6 g/dL (ref 3.5–5.0)
Alkaline Phosphatase: 49 U/L (ref 38–126)
Anion gap: 10 (ref 5–15)
BUN: 13 mg/dL (ref 6–20)
CO2: 22 mmol/L (ref 22–32)
Calcium: 10.3 mg/dL (ref 8.9–10.3)
Chloride: 107 mmol/L (ref 98–111)
Creatinine, Ser: 1.02 mg/dL (ref 0.61–1.24)
GFR, Estimated: >60 mL/min (ref >60)
Glucose, Bld: 127 mg/dL — ABNORMAL HIGH (ref 70–99)
Potassium: 3.7 mmol/L (ref 3.5–5.1)
Sodium: 139 mmol/L (ref 135–145)
Total Bilirubin: 0.5 mg/dL (ref 0.3–1.2)
Total Protein: 8.4 g/dL — ABNORMAL HIGH (ref 6.5–8.1)

## 2021-04-19 LAB — LIPASE, BLOOD: Lipase: 28 U/L (ref 11–51)

## 2021-04-19 NOTE — ED Triage Notes (Signed)
Pt states he has abdominal pain through his whole abdomen that began today. Pt states he also has pain through his right flank and that he has urinary urgency

## 2021-04-19 NOTE — ED Provider Notes (Signed)
Emergency Medicine Provider Triage Evaluation Note  Kyle Donovan , a 50 y.o. male  was evaluated in triage.  Pt complains of abd discomfort, diarrhea.  Review of Systems  Positive: Abd pain, diarrhea Negative: Nausea, vomiting  Physical Exam  BP (!) 150/95 (BP Location: Left Arm)   Pulse 73   Temp 98.1 F (36.7 C) (Oral)   Resp 17   SpO2 96%  Gen:   Awake, no distress   Resp:  Normal effort  MSK:   Moves extremities without difficulty    Medical Decision Making  Medically screening exam initiated at 9:24 PM.  Appropriate orders placed.  Kyle Donovan was informed that the remainder of the evaluation will be completed by another provider, this initial triage assessment does not replace that evaluation, and the importance of remaining in the ED until their evaluation is complete.     Kyle Donovan 04/19/21 2124    Tegeler, Canary Brim, MD 04/20/21 [phone]

## 2021-04-20 LAB — URINALYSIS, ROUTINE W REFLEX MICROSCOPIC
Bacteria, UA: NONE SEEN
Bilirubin Urine: NEGATIVE
Glucose, UA: NEGATIVE mg/dL
Ketones, ur: NEGATIVE mg/dL
Leukocytes,Ua: NEGATIVE
Nitrite: NEGATIVE
Protein, ur: NEGATIVE mg/dL
Specific Gravity, Urine: 1.027 (ref 1.005–1.030)
pH: 5 (ref 5.0–8.0)

## 2021-04-20 MED ORDER — LACTATED RINGERS IV BOLUS
1000.0000 mL | Freq: Once | INTRAVENOUS | Status: AC
  Administered 2021-04-20: 1000 mL via INTRAVENOUS

## 2021-04-20 MED ORDER — DIPHENOXYLATE-ATROPINE 2.5-0.025 MG PO TABS
2.0000 | ORAL_TABLET | Freq: Once | ORAL | Status: AC
  Administered 2021-04-20: 2 via ORAL
  Filled 2021-04-20: qty 2

## 2021-04-20 MED ORDER — DIPHENOXYLATE-ATROPINE 2.5-0.025 MG PO TABS: 1.0000 | ORAL_TABLET | Freq: Four times a day (QID) | ORAL | 0 refills | Status: DC | PRN
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
## 2021-04-20 NOTE — ED Notes (Signed)
Unable to provide stool specimen.

## 2021-05-26 ENCOUNTER — Other Ambulatory Visit: Payer: Self-pay

## 2021-05-27 ENCOUNTER — Ambulatory Visit (INDEPENDENT_AMBULATORY_CARE_PROVIDER_SITE_OTHER): Payer: Managed Care, Other (non HMO) | Admitting: Internal Medicine

## 2021-05-27 ENCOUNTER — Encounter: Payer: Self-pay | Admitting: Internal Medicine

## 2021-05-27 VITALS — BP 144/86 | HR 77 | Temp 98.3°F | Resp 16 | Ht 67.0 in | Wt 230.0 lb

## 2021-05-27 DIAGNOSIS — R011 Cardiac murmur, unspecified: Secondary | ICD-10-CM | POA: Diagnosis not present

## 2021-05-27 DIAGNOSIS — R739 Hyperglycemia, unspecified: Secondary | ICD-10-CM

## 2021-05-27 DIAGNOSIS — R3129 Other microscopic hematuria: Secondary | ICD-10-CM | POA: Diagnosis not present

## 2021-05-27 DIAGNOSIS — I10 Essential (primary) hypertension: Secondary | ICD-10-CM | POA: Diagnosis not present

## 2021-05-27 DIAGNOSIS — Z0001 Encounter for general adult medical examination with abnormal findings: Secondary | ICD-10-CM | POA: Diagnosis not present

## 2021-05-27 DIAGNOSIS — Z1211 Encounter for screening for malignant neoplasm of colon: Secondary | ICD-10-CM

## 2021-05-27 DIAGNOSIS — Z23 Encounter for immunization: Secondary | ICD-10-CM | POA: Diagnosis not present

## 2021-05-27 DIAGNOSIS — Z1159 Encounter for screening for other viral diseases: Secondary | ICD-10-CM | POA: Insufficient documentation

## 2021-05-27 MED ORDER — NEBIVOLOL HCL 10 MG PO TABS: 10.0000 mg | ORAL_TABLET | Freq: Every day | ORAL | 1 refills | Status: DC

## 2021-05-27 NOTE — Patient Instructions (Signed)
Health Maintenance, Male Adopting a healthy lifestyle and getting preventive care are important in promoting health and wellness. Ask your health care provider about: The right schedule for you to have regular tests and exams. Things you can do on your own to prevent diseases and keep yourself healthy. What should I know about diet, weight, and exercise? Eat a healthy diet  Eat a diet that includes plenty of vegetables, fruits, low-fat dairy products, and lean protein. Do not eat a lot of foods that are high in solid fats, added sugars, or sodium. Maintain a healthy weight Body mass index (BMI) is a measurement that can be used to identify possible weight problems. It estimates body fat based on height and weight. Your health care provider can help determine your BMI and help you achieve or maintain a healthy weight. Get regular exercise Get regular exercise. This is one of the most important things you can do for your health. Most adults should: Exercise for at least 150 minutes each week. The exercise should increase your heart rate and make you sweat (moderate-intensity exercise). Do strengthening exercises at least twice a week. This is in addition to the moderate-intensity exercise. Spend less time sitting. Even light physical activity can be beneficial. Watch cholesterol and blood lipids Have your blood tested for lipids and cholesterol at 49 years of age, then have this test every 5 years. You may need to have your cholesterol levels checked more often if: Your lipid or cholesterol levels are high. You are older than 49 years of age. You are at high risk for heart disease. What should I know about cancer screening? Many types of cancers can be detected early and may often be prevented. Depending on your health history and family history, you may need to have cancer screening at various ages. This may include screening for: Colorectal cancer. Prostate cancer. Skin cancer. Lung  cancer. What should I know about heart disease, diabetes, and high blood pressure? Blood pressure and heart disease High blood pressure causes heart disease and increases the risk of stroke. This is more likely to develop in people who have high blood pressure readings, are of African descent, or are overweight. Talk with your health care provider about your target blood pressure readings. Have your blood pressure checked: Every 3-5 years if you are 18-39 years of age. Every year if you are 40 years old or older. If you are between the ages of 65 and 75 and are a current or former smoker, ask your health care provider if you should have a one-time screening for abdominal aortic aneurysm (AAA). Diabetes Have regular diabetes screenings. This checks your fasting blood sugar level. Have the screening done: Once every three years after age 45 if you are at a normal weight and have a low risk for diabetes. More often and at a younger age if you are overweight or have a high risk for diabetes. What should I know about preventing infection? Hepatitis B If you have a higher risk for hepatitis B, you should be screened for this virus. Talk with your health care provider to find out if you are at risk for hepatitis B infection. Hepatitis C Blood testing is recommended for: Everyone born from 1945 through 1965. Anyone with known risk factors for hepatitis C. Sexually transmitted infections (STIs) You should be screened each year for STIs, including gonorrhea and chlamydia, if: You are sexually active and are younger than 49 years of age. You are older than 49 years   of age and your health care provider tells you that you are at risk for this type of infection. Your sexual activity has changed since you were last screened, and you are at increased risk for chlamydia or gonorrhea. Ask your health care provider if you are at risk. Ask your health care provider about whether you are at high risk for HIV.  Your health care provider may recommend a prescription medicine to help prevent HIV infection. If you choose to take medicine to prevent HIV, you should first get tested for HIV. You should then be tested every 3 months for as long as you are taking the medicine. Follow these instructions at home: Lifestyle Do not use any products that contain nicotine or tobacco, such as cigarettes, e-cigarettes, and chewing tobacco. If you need help quitting, ask your health care provider. Do not use street drugs. Do not share needles. Ask your health care provider for help if you need support or information about quitting drugs. Alcohol use Do not drink alcohol if your health care provider tells you not to drink. If you drink alcohol: Limit how much you have to 0-2 drinks a day. Be aware of how much alcohol is in your drink. In the [place] one drink equals one 12 oz bottle of beer (355 mL), one 5 oz glass of wine (148 mL), or one 1 oz glass of hard liquor (44 mL). General instructions Schedule regular health, dental, and eye exams. Stay current with your vaccines. Tell your health care provider if: You often feel depressed. You have ever been abused or do not feel safe at home. Summary Adopting a healthy lifestyle and getting preventive care are important in promoting health and wellness. Follow your health care provider's instructions about healthy diet, exercising, and getting tested or screened for diseases. Follow your health care provider's instructions on monitoring your cholesterol and blood pressure. This information is not intended to replace advice given to you by your health care provider. Make sure you discuss any questions you have with your health care provider. Document Revised: 10/16/2020 Document Reviewed: 08/01/2018 Elsevier Patient Education  2022 [employer]vier Inc.  

## 2021-05-27 NOTE — Progress Notes (Signed)
Subjective:  Patient ID: Kyle Donovan, male    DOB: [birth year]  Age: 50 y.o. MRN: [id number]  CC: Annual Exam and Hypertension  This visit occurred during the SARS-CoV-2 public health emergency.  Safety protocols were in place, including screening questions prior to the visit, additional usage of staff PPE, and extensive cleaning of exam room while observing appropriate contact time as indicated for disinfecting solutions.    HPI Kyle Donovan presents for a CPX and f/up -  According to prescription refills he would have run out of his antihypertensives many months ago.  He was recently seen in the ED for an episode of diarrhea.  Those symptoms have resolved.  He denies abdominal pain, nausea, vomiting, dysuria, or hematuria.  He has had a trace amount of blood in his urine for at least 3 years.  CT scan done 3 years ago was unremarkable.  Outpatient Medications Prior to Visit  Medication Sig Dispense Refill   albuterol (VENTOLIN HFA) 108 (90 Base) MCG/ACT inhaler Inhale 2 puffs into the lungs every 6 (six) hours as needed for wheezing or shortness of breath.     diphenoxylate-atropine (LOMOTIL) 2.5-0.025 MG tablet Take 1 tablet by mouth 4 (four) times daily as needed for diarrhea or loose stools. 30 tablet 0   nebivolol (BYSTOLIC) 10 MG tablet Take 1 tablet (10 mg total) by mouth daily. 90 tablet 1   No facility-administered medications prior to visit.    ROS Review of Systems  Constitutional:  Negative for appetite change, diaphoresis, fatigue and unexpected weight change.  HENT: Negative.    Eyes: Negative.   Respiratory:  Negative for cough, chest tightness, shortness of breath and wheezing.   Cardiovascular:  Negative for chest pain, palpitations and leg swelling.  Gastrointestinal:  Negative for abdominal pain, constipation, diarrhea, nausea and vomiting.  Endocrine: Negative.   Genitourinary: Negative.  Negative for difficulty urinating, flank pain, hematuria,  testicular pain and urgency.  Musculoskeletal: Negative.  Negative for arthralgias and myalgias.  Skin: Negative.   Neurological: Negative.  Negative for dizziness, weakness, light-headedness and headaches.  Hematological:  Negative for adenopathy. Does not bruise/bleed easily.  Psychiatric/Behavioral: Negative.     Objective:  BP (!) 144/86 (BP Location: Left Arm, Patient Position: Sitting, Cuff Size: Large)   Pulse 77   Temp 98.3 F (36.8 C) (Oral)   Resp 16   Ht 5\' 7"  (1.702 m)   Wt 230 lb (104.3 kg)   SpO2 97%   BMI 36.02 kg/m   BP Readings from Last 3 Encounters:  05/27/21 (!) 144/86  04/20/21 133/87  09/12/20 (!) 148/102    Wt Readings from Last 3 Encounters:  05/27/21 230 lb (104.3 kg)  04/19/21 229 lb 0.9 oz (103.9 kg)  01/06/20 229 lb (103.9 kg)    Physical Exam Vitals reviewed.  HENT:     Nose: Nose normal.     Mouth/Throat:     Mouth: Mucous membranes are moist.  Eyes:     General: No scleral icterus.    Conjunctiva/sclera: Conjunctivae normal.  Cardiovascular:     Rate and Rhythm: Normal rate and regular rhythm.     Heart sounds: Murmur heard.  Systolic murmur is present with a grade of 1/6.    No gallop.     Comments: 1/6 SEM RUSB  EKG- NSR, 69 bpm No LVH or Q waves Inverted T wave in III and V1 Pulmonary:     Breath sounds: No stridor. No wheezing, rhonchi or rales.  Abdominal:     General: Abdomen is protuberant. Bowel sounds are normal.     Palpations: There is no hepatomegaly, splenomegaly or mass.     Tenderness: There is no abdominal tenderness. There is no guarding.     Hernia: There is no hernia in the left inguinal area or right inguinal area.  Genitourinary:    Pubic Area: No rash.      Penis: Normal and circumcised.      Testes: Normal.     Epididymis:     Right: Normal.     Left: Normal.     Prostate: Normal. Not enlarged, not tender and no nodules present.     Rectum: Normal. Guaiac result negative. No mass, tenderness, anal  fissure, external hemorrhoid or internal hemorrhoid. Normal anal tone.  Musculoskeletal:        General: Normal range of motion.     Cervical back: Neck supple.     Right lower leg: No edema.     Left lower leg: No edema.  Lymphadenopathy:     Cervical: No cervical adenopathy.     Lower Body: No right inguinal adenopathy. No left inguinal adenopathy.  Skin:    General: Skin is warm and dry.  Neurological:     General: No focal deficit present.     Mental Status: He is alert.  Psychiatric:        Mood and Affect: Mood normal.        Behavior: Behavior normal.    Lab Results  Component Value Date   WBC 8.3 04/19/2021   HGB 14.5 04/19/2021   HCT 44.2 04/19/2021   PLT 256 04/19/2021   GLUCOSE 84 05/27/2021   CHOL 167 05/27/2021   TRIG 93.0 05/27/2021   HDL 67.20 05/27/2021   LDLCALC 81 05/27/2021   ALT 23 04/19/2021   AST 21 04/19/2021   NA 143 05/27/2021   K 3.8 05/27/2021   CL 104 05/27/2021   CREATININE 1.17 05/27/2021   BUN 10 05/27/2021   CO2 32 05/27/2021   TSH 1.50 05/27/2021   PSA 0.75 05/27/2021   HGBA1C 5.1 05/27/2021    No results found.  Assessment & Plan:   Kyle Donovan was seen today for annual exam and hypertension.  Diagnoses and all orders for this visit:  Essential hypertension, benign- His blood pressure is not adequately well controlled.  Will check labs to screen for secondary causes and endorgan damage.  Will restart nebivolol. -     Cancel: CBC with Differential/Platelet; Future -     Basic metabolic panel; Future -     TSH; Future -     Urinalysis, Routine w reflex microscopic; Future -     Cancel: Hepatic function panel; Future -     EKG 12-Lead -     nebivolol (BYSTOLIC) 10 MG tablet; Take 1 tablet (10 mg total) by mouth daily. -     Urinalysis, Routine w reflex microscopic -     TSH -     Basic metabolic panel  Other microscopic hematuria- This is a chronic, stable finding consistent with a benign etiology. -     Urinalysis, Routine w  reflex microscopic; Future -     Urinalysis, Routine w reflex microscopic  Hyperglycemia- His A1c is normal. -     Basic metabolic panel; Future -     Hemoglobin A1c; Future -     Hemoglobin A1c -     Basic metabolic panel  Encounter for general adult medical examination  with abnormal findings- Exam completed, labs reviewed, vaccines reviewed and updated, cancer screenings addressed. -     Lipid panel; Future -     PSA; Future -     PSA -     Lipid panel  Need for hepatitis C screening test -     Hepatitis C antibody; Future -     Hepatitis C antibody  Cardiac murmur -     ECHOCARDIOGRAM COMPLETE; Future  Colon cancer screening -     Cologuard  Other orders -     Flu Vaccine QUAD 6+ mos PF IM (Fluarix Quad PF)  I have discontinued Anterrio J. Haroon's diphenoxylate-atropine. I am also having him maintain his albuterol and nebivolol.  Meds ordered this encounter  Medications   nebivolol (BYSTOLIC) 10 MG tablet    Sig: Take 1 tablet (10 mg total) by mouth daily.    Dispense:  90 tablet    Refill:  1      Follow-up: Return in about 3 months (around 08/27/2021).  Sanda Linger, MD

## 2021-05-28 LAB — BASIC METABOLIC PANEL
BUN: 10 mg/dL (ref 6–23)
CO2: 32 mEq/L (ref 19–32)
Calcium: 9.9 mg/dL (ref 8.4–10.5)
Chloride: 104 mEq/L (ref 96–112)
Creatinine, Ser: 1.17 mg/dL (ref 0.40–1.50)
GFR: 73.23 mL/min (ref >60.00)
Glucose, Bld: 84 mg/dL (ref 70–99)
Potassium: 3.8 mEq/L (ref 3.5–5.1)
Sodium: 143 mEq/L (ref 135–145)

## 2021-05-28 LAB — LIPID PANEL
Cholesterol: 167 mg/dL (ref 0–200)
HDL: 67.2 mg/dL (ref >39.00)
LDL Cholesterol: 81 mg/dL (ref 0–99)
NonHDL: 99.69
Total CHOL/HDL Ratio: 2
Triglycerides: 93 mg/dL (ref 0.0–149.0)
VLDL: 18.6 mg/dL (ref 0.0–40.0)

## 2021-05-28 LAB — PSA: PSA: 0.75 ng/mL (ref 0.10–4.00)

## 2021-05-28 LAB — URINALYSIS, ROUTINE W REFLEX MICROSCOPIC
Bilirubin Urine: NEGATIVE
Ketones, ur: NEGATIVE
Leukocytes,Ua: NEGATIVE
Nitrite: NEGATIVE
Specific Gravity, Urine: >=1.03 — AB (ref 1.000–1.030)
Urine Glucose: NEGATIVE
Urobilinogen, UA: 2 — AB (ref 0.0–1.0)
pH: 6 (ref 5.0–8.0)

## 2021-05-28 LAB — TSH: TSH: 1.5 u[IU]/mL (ref 0.35–5.50)

## 2021-05-28 LAB — HEPATITIS C ANTIBODY
Hepatitis C Ab: NONREACTIVE
SIGNAL TO CUT-OFF: 0.01 (ref <1.00)

## 2021-05-28 LAB — HEMOGLOBIN A1C: Hgb A1c MFr Bld: 5.1 % (ref 4.6–6.5)

## 2021-06-10 LAB — COLOGUARD

## 2021-06-22 ENCOUNTER — Other Ambulatory Visit (HOSPITAL_BASED_OUTPATIENT_CLINIC_OR_DEPARTMENT_OTHER): Payer: Managed Care, Other (non HMO)

## 2021-07-05 ENCOUNTER — Other Ambulatory Visit (HOSPITAL_BASED_OUTPATIENT_CLINIC_OR_DEPARTMENT_OTHER): Payer: Managed Care, Other (non HMO)

## 2021-07-20 ENCOUNTER — Encounter (HOSPITAL_BASED_OUTPATIENT_CLINIC_OR_DEPARTMENT_OTHER): Payer: Self-pay | Admitting: *Deleted

## 2021-07-30 ENCOUNTER — Other Ambulatory Visit (HOSPITAL_BASED_OUTPATIENT_CLINIC_OR_DEPARTMENT_OTHER): Payer: Managed Care, Other (non HMO)

## 2022-01-21 ENCOUNTER — Ambulatory Visit (AMBULATORY_SURGERY_CENTER): Payer: Self-pay

## 2022-01-21 ENCOUNTER — Other Ambulatory Visit: Payer: Self-pay

## 2022-01-21 VITALS — Ht 67.0 in | Wt 236.0 lb

## 2022-01-21 DIAGNOSIS — Z1211 Encounter for screening for malignant neoplasm of colon: Secondary | ICD-10-CM

## 2022-01-21 MED ORDER — PLENVU 140 G PO SOLR
1.0000 | Freq: Once | ORAL | 0 refills | Status: AC
Start: 2022-01-21 — End: 2022-01-21

## 2022-01-21 NOTE — Progress Notes (Signed)
Denies allergies to eggs or soy products. Denies complication of anesthesia or sedation. Denies use of weight loss medication. Denies use of O2.   Emmi instructions given for colonoscopy.  

## 2022-02-04 ENCOUNTER — Encounter (HOSPITAL_COMMUNITY): Payer: Self-pay

## 2022-02-04 ENCOUNTER — Emergency Department (HOSPITAL_COMMUNITY)
Admission: EM | Admit: 2022-02-04 | Discharge: 2022-02-05 | Disposition: A | Discharge status: Home / Self Care | Payer: Managed Care, Other (non HMO) | Attending: Emergency Medicine | Admitting: Emergency Medicine

## 2022-02-04 ENCOUNTER — Telehealth: Payer: Self-pay | Admitting: Gastroenterology

## 2022-02-04 ENCOUNTER — Other Ambulatory Visit: Payer: Self-pay

## 2022-02-04 DIAGNOSIS — B349 Viral infection, unspecified: Secondary | ICD-10-CM | POA: Diagnosis not present

## 2022-02-04 DIAGNOSIS — R21 Rash and other nonspecific skin eruption: Secondary | ICD-10-CM | POA: Insufficient documentation

## 2022-02-04 LAB — GROUP A STREP BY PCR: Group A Strep by PCR: NOT DETECTED

## 2022-02-04 NOTE — Telephone Encounter (Signed)
Called pharmacy-gave the [employer] coupon information.  Patient notified we do not do PA's. Patient will use [employer] coupon he has.

## 2022-02-04 NOTE — ED Triage Notes (Signed)
Patient said he woke up with a rash on his left hand, head and back. Also woke up with a sore throat.

## 2022-02-04 NOTE — Telephone Encounter (Signed)
Patient returned your call, please advise. 

## 2022-02-04 NOTE — Telephone Encounter (Signed)
Patient called stating there was a problem with his prep for his colonoscopy.  It may need a prior authorization or some other substitute should be called in.  Procedure is Thursday and he wants to go ahead and get it.  He also asked for a phone call to let him know what's going on.  Thank you.

## 2022-02-05 LAB — CBC WITH DIFFERENTIAL/PLATELET
Abs Immature Granulocytes: 0.02 10*3/uL (ref 0.00–0.07)
Basophils Absolute: 0 10*3/uL (ref 0.0–0.1)
Basophils Relative: 0 %
Eosinophils Absolute: 0.2 10*3/uL (ref 0.0–0.5)
Eosinophils Relative: 4 %
HCT: 40.1 % (ref 39.0–52.0)
Hemoglobin: 13.2 g/dL (ref 13.0–17.0)
Immature Granulocytes: 0 %
Lymphocytes Relative: 28 %
Lymphs Abs: 1.5 10*3/uL (ref 0.7–4.0)
MCH: 31.7 pg (ref 26.0–34.0)
MCHC: 32.9 g/dL (ref 30.0–36.0)
MCV: 96.4 fL (ref 80.0–100.0)
Monocytes Absolute: 0.5 10*3/uL (ref 0.1–1.0)
Monocytes Relative: 10 %
Neutro Abs: 3 10*3/uL (ref 1.7–7.7)
Neutrophils Relative %: 58 %
Platelets: 202 10*3/uL (ref 150–400)
RBC: 4.16 MIL/uL — ABNORMAL LOW (ref 4.22–5.81)
RDW: 12 % (ref 11.5–15.5)
WBC: 5.3 10*3/uL (ref 4.0–10.5)
nRBC: 0 % (ref 0.0–0.2)

## 2022-02-05 LAB — BASIC METABOLIC PANEL
Anion gap: 8 (ref 5–15)
BUN: 19 mg/dL (ref 6–20)
CO2: 25 mmol/L (ref 22–32)
Calcium: 9.4 mg/dL (ref 8.9–10.3)
Chloride: 107 mmol/L (ref 98–111)
Creatinine, Ser: 1.28 mg/dL — ABNORMAL HIGH (ref 0.61–1.24)
GFR, Estimated: >60 mL/min (ref >60)
Glucose, Bld: 107 mg/dL — ABNORMAL HIGH (ref 70–99)
Potassium: 4.2 mmol/L (ref 3.5–5.1)
Sodium: 140 mmol/L (ref 135–145)

## 2022-02-05 LAB — RPR: RPR Ser Ql: NONREACTIVE

## 2022-02-05 MED ORDER — DEXAMETHASONE SODIUM PHOSPHATE 10 MG/ML IJ SOLN
10.0000 mg | Freq: Once | INTRAMUSCULAR | Status: AC
  Administered 2022-02-05: 10 mg via INTRAMUSCULAR
  Filled 2022-02-05: qty 1

## 2022-02-05 NOTE — ED Provider Notes (Signed)
[facility]-EMERGENCY DEPT Provider Note   CSN: [id number] Arrival date & time: 02/04/22  2123     History {Add pertinent medical, surgical, social history, OB history to HPI:1} Chief Complaint  Patient presents with   Rash    Kyle Donovan is a 51 y.o. male.  Patient presents to the emergency department for evaluation of rash.  Patient reports that he has a rash on his face and back.  He woke up with this rash today.  He denies itching and pain.  Patient also reports that he has had a sore throat through the day.  No fever.       Home Medications Prior to Admission medications   Medication Sig Start Date End Date Taking? Authorizing Provider  albuterol (VENTOLIN HFA) 108 (90 Base) MCG/ACT inhaler Inhale 2 puffs into the lungs every 6 (six) hours as needed for wheezing or shortness of breath.    [provider]  nebivolol (BYSTOLIC) 10 MG tablet Take 1 tablet (10 mg total) by mouth daily. 05/27/21   Etta Grandchild, MD      Allergies    Patient has no known allergies.    Review of Systems   Review of Systems  Physical Exam Updated Vital Signs BP (!) 139/99 (BP Location: Left Arm)   Pulse 92   Temp 99.2 F (37.3 C) (Oral)   Resp 16   SpO2 95%  Physical Exam Vitals and nursing note reviewed.  Constitutional:      General: He is not in acute distress.    Appearance: He is well-developed.  HENT:     Head: Normocephalic and atraumatic.     Mouth/Throat:     Mouth: Mucous membranes are moist.     Pharynx: Posterior oropharyngeal erythema (With ulcerations) present.  Eyes:     General: Vision grossly intact. Gaze aligned appropriately.     Extraocular Movements: Extraocular movements intact.     Conjunctiva/sclera: Conjunctivae normal.  Cardiovascular:     Rate and Rhythm: Normal rate and regular rhythm.     Pulses: Normal pulses.     Heart sounds: Normal heart sounds, S1 normal and S2 normal. No murmur heard.    No friction  rub. No gallop.  Pulmonary:     Effort: Pulmonary effort is normal. No respiratory distress.     Breath sounds: Normal breath sounds.  Abdominal:     Palpations: Abdomen is soft.     Tenderness: There is no abdominal tenderness. There is no guarding or rebound.     Hernia: No hernia is present.  Musculoskeletal:        General: No swelling.     Cervical back: Full passive range of motion without pain, normal range of motion and neck supple. No pain with movement, spinous process tenderness or muscular tenderness. Normal range of motion.     Right lower leg: No edema.     Left lower leg: No edema.  Skin:    General: Skin is warm and dry.     Capillary Refill: Capillary refill takes less than 2 seconds.     Findings: Rash (Small erythematous papular outbreak across forehead, bilateral, and diffusely on back, bilateral) present. No ecchymosis, erythema, lesion or wound.  Neurological:     Mental Status: He is alert and oriented to person, place, and time.     GCS: GCS eye subscore is 4. GCS verbal subscore is 5. GCS motor subscore is 6.     Cranial Nerves: Cranial  nerves 2-12 are intact.     Sensory: Sensation is intact.     Motor: Motor function is intact. No weakness or abnormal muscle tone.     Coordination: Coordination is intact.  Psychiatric:        Mood and Affect: Mood normal.        Speech: Speech normal.        Behavior: Behavior normal.     ED Results / Procedures / Treatments   Labs (all labs ordered are listed, but only abnormal results are displayed) Labs Reviewed  GROUP A STREP BY PCR    EKG None  Radiology No results found.  Procedures Procedures  {Document cardiac monitor, telemetry assessment procedure when appropriate:1}  Medications Ordered in ED Medications - No data to display  ED Course/ Medical Decision Making/ A&P                           Medical Decision Making Amount and/or Complexity of Data Reviewed Labs: ordered.   Presents to the  emergency department for evaluation of nonspecific rash.  Patient with rash on his face and back.  Rash is symmetric, bilateral, not consistent with zoster.  No new skin products, medications or foods, does not look allergic.  Patient with a sore throat, strep negative.  He does have oropharyngeal ulcerations.  Additionally he does have some erythematous blistering to the soles of his feet and on the palms of his hands.  Morphologically, rash consistent with viral enanthem and exanthem.  RPR pending.  Treat symptomatically.  {Document critical care time when appropriate:1} {Document review of labs and clinical decision tools ie heart score, Chads2Vasc2 etc:1}  {Document your independent review of radiology images, and any outside records:1} {Document your discussion with family members, caretakers, and with consultants:1} {Document social determinants of health affecting pt's care:1} {Document your decision making why or why not admission, treatments were needed:1} Final Clinical Impression(s) / ED Diagnoses Final diagnoses:  None    Rx / DC Orders ED Discharge Orders     None

## 2022-02-08 ENCOUNTER — Encounter: Payer: Self-pay | Admitting: Gastroenterology

## 2022-02-10 ENCOUNTER — Encounter: Payer: Self-pay | Admitting: Gastroenterology

## 2022-02-10 ENCOUNTER — Ambulatory Visit (AMBULATORY_SURGERY_CENTER): Payer: Managed Care, Other (non HMO) | Admitting: Gastroenterology

## 2022-02-10 VITALS — BP 127/69 | HR 60 | Temp 96.8°F | Resp 11 | Ht 67.0 in | Wt 236.0 lb

## 2022-02-10 DIAGNOSIS — Z1211 Encounter for screening for malignant neoplasm of colon: Secondary | ICD-10-CM | POA: Diagnosis present

## 2022-02-10 MED ORDER — SODIUM CHLORIDE 0.9 % IV SOLN: 500.0000 mL | Freq: Once | INTRAVENOUS | Status: DC

## 2022-02-10 NOTE — Progress Notes (Signed)
Pt reported he was seen at [facility] ED for rash of face, forehead and back. He also had a sore throat.    He was treated, given a steroid shot and sent home.  Pt reports this is the only change since his PV.    Pt albuterol inhaler is at the bedside with label attached.MAW

## 2022-02-10 NOTE — Op Note (Signed)
[facility] Patient Name: Kyle Donovan Procedure Date: 02/10/2022 9:58 AM MRN: [id number] Endoscopist: Meryl Dare , MD Age: 51 Referring MD:  Date of Birth: [birth year] Gender: Male Account #: [id number] Procedure:                Colonoscopy Indications:              Screening for colorectal malignant neoplasm Medicines:                Monitored Anesthesia Care Procedure:                Pre-Anesthesia Assessment:                           - Prior to the procedure, a History and Physical                            was performed, and patient medications and                            allergies were reviewed. The patient's tolerance of                            previous anesthesia was also reviewed. The risks                            and benefits of the procedure and the sedation                            options and risks were discussed with the patient.                            All questions were answered, and informed consent                            was obtained. Prior Anticoagulants: The patient has                            taken no previous anticoagulant or antiplatelet                            agents. ASA Grade Assessment: II - A patient with                            mild systemic disease. After reviewing the risks                            and benefits, the patient was deemed in                            satisfactory condition to undergo the procedure.                           After obtaining informed consent, the colonoscope  was passed under direct vision. Throughout the                            procedure, the patient's blood pressure, pulse, and                            oxygen saturations were monitored continuously. The                            CF [id number] #[id number] was introduced through the anus                            and advanced to the the cecum, identified by                            appendiceal  orifice and ileocecal valve. The                            ileocecal valve, appendiceal orifice, and rectum                            were photographed. The quality of the bowel                            preparation was good. The colonoscopy was performed                            without difficulty. The patient tolerated the                            procedure well. Scope In: 10:09:09 AM Scope Out: 10:18:58 AM Scope Withdrawal Time: 0 hours 7 minutes 36 seconds  Total Procedure Duration: 0 hours 9 minutes 49 seconds  Findings:                 The perianal and digital rectal examinations were                            normal.                           Multiple small-mouthed diverticula were found in                            the sigmoid colon, descending colon, transverse                            colon and ascending colon. There was no evidence of                            diverticular bleeding.                           Internal hemorrhoids were found during  retroflexion. The hemorrhoids were small and Grade                            I (internal hemorrhoids that do not prolapse).                           The exam was otherwise without abnormality on                            direct and retroflexion views. Complications:            No immediate complications. Estimated blood loss:                            None. Estimated Blood Loss:     Estimated blood loss: none. Impression:               - Mild diverticulosis in the sigmoid colon, in the                            descending colon, in the ascending and in the                            transverse colon. There was no evidence of                            diverticular bleeding.                           - Internal hemorrhoids.                           - The examination was otherwise normal on direct                            and retroflexion views.                           - No specimens  collected. Recommendation:           - Repeat colonoscopy in 10 years for screening                            purposes.                           - Patient has a contact number available for                            emergencies. The signs and symptoms of potential                            delayed complications were discussed with the                            patient. Return to normal activities tomorrow.  Written discharge instructions were provided to the                            patient.                           - High fiber diet.                           - Continue present medications. Ladene Artist, MD 02/10/2022 10:22:06 AM This report has been signed electronically.
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
## 2022-02-10 NOTE — Patient Instructions (Signed)
No polyps!!!  Next colonoscopy- 10 years!  Please read over handouts about hemorrhoids, high fiber diets, and diverticulosis  Please continue your normal medications    YOU HAD AN ENDOSCOPIC PROCEDURE TODAY AT THE [facility]:   Refer to the procedure report that was given to you for any specific questions about what was found during the examination.  If the procedure report does not answer your questions, please call your gastroenterologist to clarify.  If you requested that your care partner not be given the details of your procedure findings, then the procedure report has been included in a sealed envelope for you to review at your convenience later.  YOU SHOULD EXPECT: Some feelings of bloating in the abdomen. Passage of more gas than usual.  Walking can help get rid of the air that was put into your GI tract during the procedure and reduce the bloating. If you had a lower endoscopy (such as a colonoscopy or flexible sigmoidoscopy) you may notice spotting of blood in your stool or on the toilet paper. If you underwent a bowel prep for your procedure, you may not have a normal bowel movement for a few days.  Please Note:  You might notice some irritation and congestion in your nose or some drainage.  This is from the oxygen used during your procedure.  There is no need for concern and it should clear up in a day or so.  SYMPTOMS TO REPORT IMMEDIATELY:  Following lower endoscopy (colonoscopy or flexible sigmoidoscopy):  Excessive amounts of blood in the stool  Significant tenderness or worsening of abdominal pains  Swelling of the abdomen that is new, acute  Fever of 100F or higher  For urgent or emergent issues, a gastroenterologist can be reached at any hour by calling [phone]. Do not use MyChart messaging for urgent concerns.    DIET:  We do recommend a small meal at first, but then you may proceed to your regular diet.  Drink plenty of fluids but you should avoid  alcoholic beverages for 24 hours.  ACTIVITY:  You should plan to take it easy for the rest of today and you should NOT DRIVE or use heavy machinery until tomorrow (because of the sedation medicines used during the test).    FOLLOW UP: Our staff will call the number listed on your records 24-72 hours following your procedure to check on you and address any questions or concerns that you may have regarding the information given to you following your procedure. If we do not reach you, we will leave a message.  We will attempt to reach you two times.  During this call, we will ask if you have developed any symptoms of COVID 19. If you develop any symptoms (ie: fever, flu-like symptoms, shortness of breath, cough etc.) before then, please call [phone].  If you test positive for Covid 19 in the 2 weeks post procedure, please call and report this information to [place].     SIGNATURES/CONFIDENTIALITY: You and/or your care partner have signed paperwork which will be entered into your electronic medical record.  These signatures attest to the fact that that the information above on your After Visit Summary has been reviewed and is understood.  Full responsibility of the confidentiality of this discharge information lies with you and/or your care-partner.

## 2022-02-11 ENCOUNTER — Telehealth: Payer: Self-pay

## 2022-08-16 ENCOUNTER — Other Ambulatory Visit: Payer: Self-pay | Admitting: Internal Medicine

## 2022-08-16 DIAGNOSIS — I10 Essential (primary) hypertension: Secondary | ICD-10-CM

## 2022-08-17 ENCOUNTER — Telehealth: Payer: Self-pay | Admitting: Internal Medicine

## 2022-08-17 NOTE — Telephone Encounter (Signed)
Caller & Relationship to patient:  self   Call back number:  [phone]   Date of last office visit:   Date of next office visit:  09/20/2022   Medication(s) to be refilled:  bystolic 10 mg.        [facility]   [facility] [address] [facility] [place]

## 2022-08-19 ENCOUNTER — Other Ambulatory Visit: Payer: Self-pay | Admitting: Internal Medicine

## 2022-08-19 DIAGNOSIS — I10 Essential (primary) hypertension: Secondary | ICD-10-CM

## 2022-08-19 MED ORDER — NEBIVOLOL HCL 10 MG PO TABS: 10.0000 mg | ORAL_TABLET | Freq: Every day | ORAL | 0 refills | Status: DC

## 2022-08-19 NOTE — Telephone Encounter (Signed)
Patient has an appointment scheduled now for 09/20/2022 - can medication be sent in till his appointment

## 2022-09-11 ENCOUNTER — Other Ambulatory Visit: Payer: Self-pay | Admitting: Internal Medicine

## 2022-09-11 DIAGNOSIS — I10 Essential (primary) hypertension: Secondary | ICD-10-CM

## 2022-09-20 ENCOUNTER — Encounter: Payer: Self-pay | Admitting: Internal Medicine

## 2022-09-20 ENCOUNTER — Ambulatory Visit (INDEPENDENT_AMBULATORY_CARE_PROVIDER_SITE_OTHER): Payer: Self-pay | Admitting: Internal Medicine

## 2022-09-20 VITALS — BP 138/82 | HR 66 | Temp 98.5°F | Ht 67.0 in | Wt 229.0 lb

## 2022-09-20 DIAGNOSIS — N5201 Erectile dysfunction due to arterial insufficiency: Secondary | ICD-10-CM

## 2022-09-20 DIAGNOSIS — Z0001 Encounter for general adult medical examination with abnormal findings: Secondary | ICD-10-CM

## 2022-09-20 DIAGNOSIS — K219 Gastro-esophageal reflux disease without esophagitis: Secondary | ICD-10-CM

## 2022-09-20 DIAGNOSIS — Z0000 Encounter for general adult medical examination without abnormal findings: Secondary | ICD-10-CM

## 2022-09-20 DIAGNOSIS — I10 Essential (primary) hypertension: Secondary | ICD-10-CM

## 2022-09-20 DIAGNOSIS — Z23 Encounter for immunization: Secondary | ICD-10-CM

## 2022-09-20 DIAGNOSIS — Z125 Encounter for screening for malignant neoplasm of prostate: Secondary | ICD-10-CM

## 2022-09-20 LAB — CBC WITH DIFFERENTIAL/PLATELET
Basophils Absolute: 0 10*3/uL (ref 0.0–0.1)
Basophils Relative: 0.8 % (ref 0.0–3.0)
Eosinophils Absolute: 0.5 10*3/uL (ref 0.0–0.7)
Eosinophils Relative: 8.8 % — ABNORMAL HIGH (ref 0.0–5.0)
HCT: 41.7 % (ref 39.0–52.0)
Hemoglobin: 14.2 g/dL (ref 13.0–17.0)
Lymphocytes Relative: 44.1 % (ref 12.0–46.0)
Lymphs Abs: 2.4 10*3/uL (ref 0.7–4.0)
MCHC: 34.1 g/dL (ref 30.0–36.0)
MCV: 94.9 fl (ref 78.0–100.0)
Monocytes Absolute: 0.4 10*3/uL (ref 0.1–1.0)
Monocytes Relative: 7 % (ref 3.0–12.0)
Neutro Abs: 2.2 10*3/uL (ref 1.4–7.7)
Neutrophils Relative %: 39.3 % — ABNORMAL LOW (ref 43.0–77.0)
Platelets: 254 10*3/uL (ref 150.0–400.0)
RBC: 4.39 Mil/uL (ref 4.22–5.81)
RDW: 13 % (ref 11.5–15.5)
WBC: 5.5 10*3/uL (ref 4.0–10.5)

## 2022-09-20 LAB — URINALYSIS, ROUTINE W REFLEX MICROSCOPIC
Bilirubin Urine: NEGATIVE
Hgb urine dipstick: NEGATIVE
Ketones, ur: NEGATIVE
Leukocytes,Ua: NEGATIVE
Nitrite: NEGATIVE
Specific Gravity, Urine: >=1.03 — AB (ref 1.000–1.030)
Total Protein, Urine: NEGATIVE
Urine Glucose: NEGATIVE
Urobilinogen, UA: 0.2 (ref 0.0–1.0)
WBC, UA: NONE SEEN (ref 0–?)
pH: 6 (ref 5.0–8.0)

## 2022-09-20 LAB — HEPATIC FUNCTION PANEL
ALT: 19 U/L (ref 0–53)
AST: 20 U/L (ref 0–37)
Albumin: 4.5 g/dL (ref 3.5–5.2)
Alkaline Phosphatase: 54 U/L (ref 39–117)
Bilirubin, Direct: 0.1 mg/dL (ref 0.0–0.3)
Total Bilirubin: 0.4 mg/dL (ref 0.2–1.2)
Total Protein: 7.6 g/dL (ref 6.0–8.3)

## 2022-09-20 LAB — BASIC METABOLIC PANEL
BUN: 11 mg/dL (ref 6–23)
CO2: 29 mEq/L (ref 19–32)
Calcium: 9.6 mg/dL (ref 8.4–10.5)
Chloride: 104 mEq/L (ref 96–112)
Creatinine, Ser: 1.18 mg/dL (ref 0.40–1.50)
GFR: 71.82 mL/min (ref >60.00)
Glucose, Bld: 82 mg/dL (ref 70–99)
Potassium: 4.1 mEq/L (ref 3.5–5.1)
Sodium: 141 mEq/L (ref 135–145)

## 2022-09-20 LAB — PSA: PSA: 0.73 ng/mL (ref 0.10–4.00)

## 2022-09-20 LAB — TSH: TSH: 1.87 u[IU]/mL (ref 0.35–5.50)

## 2022-09-20 MED ORDER — NEBIVOLOL HCL 10 MG PO TABS: 10.0000 mg | ORAL_TABLET | Freq: Every day | ORAL | 1 refills | Status: DC

## 2022-09-20 MED ORDER — SILDENAFIL CITRATE 20 MG PO TABS: 80.0000 mg | ORAL_TABLET | Freq: Every day | ORAL | 3 refills | Status: AC | PRN

## 2022-09-20 NOTE — Patient Instructions (Signed)
PLEASE COME BACK IN 4-6 MONTHS FOR YOUR SECOND SHINGLES VACCINE    Health Maintenance, Male Adopting a healthy lifestyle and getting preventive care are important in promoting health and wellness. Ask your health care provider about: The right schedule for you to have regular tests and exams. Things you can do on your own to prevent diseases and keep yourself healthy. What should I know about diet, weight, and exercise? Eat a healthy diet  Eat a diet that includes plenty of vegetables, fruits, low-fat dairy products, and lean protein. Do not eat a lot of foods that are high in solid fats, added sugars, or sodium. Maintain a healthy weight Body mass index (BMI) is a measurement that can be used to identify possible weight problems. It estimates body fat based on height and weight. Your health care provider can help determine your BMI and help you achieve or maintain a healthy weight. Get regular exercise Get regular exercise. This is one of the most important things you can do for your health. Most adults should: Exercise for at least 150 minutes each week. The exercise should increase your heart rate and make you sweat (moderate-intensity exercise). Do strengthening exercises at least twice a week. This is in addition to the moderate-intensity exercise. Spend less time sitting. Even light physical activity can be beneficial. Watch cholesterol and blood lipids Have your blood tested for lipids and cholesterol at 51 years of age, then have this test every 5 years. You may need to have your cholesterol levels checked more often if: Your lipid or cholesterol levels are high. You are older than 51 years of age. You are at high risk for heart disease. What should I know about cancer screening? Many types of cancers can be detected early and may often be prevented. Depending on your health history and family history, you may need to have cancer screening at various ages. This may include  screening for: Colorectal cancer. Prostate cancer. Skin cancer. Lung cancer. What should I know about heart disease, diabetes, and high blood pressure? Blood pressure and heart disease High blood pressure causes heart disease and increases the risk of stroke. This is more likely to develop in people who have high blood pressure readings or are overweight. Talk with your health care provider about your target blood pressure readings. Have your blood pressure checked: Every 3-5 years if you are 31-9 years of age. Every year if you are 71 years old or older. If you are between the ages of 14 and 39 and are a current or former smoker, ask your health care provider if you should have a one-time screening for abdominal aortic aneurysm (AAA). Diabetes Have regular diabetes screenings. This checks your fasting blood sugar level. Have the screening done: Once every three years after age 52 if you are at a normal weight and have a low risk for diabetes. More often and at a younger age if you are overweight or have a high risk for diabetes. What should I know about preventing infection? Hepatitis B If you have a higher risk for hepatitis B, you should be screened for this virus. Talk with your health care provider to find out if you are at risk for hepatitis B infection. Hepatitis C Blood testing is recommended for: Everyone born from [birth year] through 1964. Anyone with known risk factors for hepatitis C. Sexually transmitted infections (STIs) You should be screened each year for STIs, including gonorrhea and chlamydia, if: You are sexually active and are younger than  51 years of age. You are older than 51 years of age and your health care provider tells you that you are at risk for this type of infection. Your sexual activity has changed since you were last screened, and you are at increased risk for chlamydia or gonorrhea. Ask your health care provider if you are at risk. Ask your health care  provider about whether you are at high risk for HIV. Your health care provider may recommend a prescription medicine to help prevent HIV infection. If you choose to take medicine to prevent HIV, you should first get tested for HIV. You should then be tested every 3 months for as long as you are taking the medicine. Follow these instructions at home: Alcohol use Do not drink alcohol if your health care provider tells you not to drink. If you drink alcohol: Limit how much you have to 0-2 drinks a day. Know how much alcohol is in your drink. In the [place] one drink equals one 12 oz bottle of beer (355 mL), one 5 oz glass of wine (148 mL), or one 1 oz glass of hard liquor (44 mL). Lifestyle Do not use any products that contain nicotine or tobacco. These products include cigarettes, chewing tobacco, and vaping devices, such as e-cigarettes. If you need help quitting, ask your health care provider. Do not use street drugs. Do not share needles. Ask your health care provider for help if you need support or information about quitting drugs. General instructions Schedule regular health, dental, and eye exams. Stay current with your vaccines. Tell your health care provider if: You often feel depressed. You have ever been abused or do not feel safe at home. Summary Adopting a healthy lifestyle and getting preventive care are important in promoting health and wellness. Follow your health care provider's instructions about healthy diet, exercising, and getting tested or screened for diseases. Follow your health care provider's instructions on monitoring your cholesterol and blood pressure. This information is not intended to replace advice given to you by your health care provider. Make sure you discuss any questions you have with your health care provider. Document Revised: 12/28/2020 Document Reviewed: 12/28/2020 Elsevier Patient Education  [place].

## 2022-09-20 NOTE — Progress Notes (Unsigned)
Subjective:  Patient ID: Kyle Donovan, male    DOB: 1970/09/22  Age: 52 y.o. MRN: [id number]  CC: Annual Exam, Hypertension, and Gastroesophageal Reflux   HPI Kyle Donovan presents for a CPX and f/up-   He is active and denies chest pain, shortness of breath, diaphoresis, or edema.  Outpatient Medications Prior to Visit  Medication Sig Dispense Refill   albuterol (VENTOLIN HFA) 108 (90 Base) MCG/ACT inhaler Inhale 2 puffs into the lungs every 6 (six) hours as needed for wheezing or shortness of breath.     nebivolol (BYSTOLIC) 10 MG tablet TAKE 1 TABLET BY MOUTH EVERY DAY 30 tablet 0   No facility-administered medications prior to visit.    ROS Review of Systems  Constitutional: Negative.  Negative for chills, diaphoresis, fatigue and fever.  HENT: Negative.    Eyes: Negative.   Respiratory: Negative.  Negative for cough, chest tightness, shortness of breath and wheezing.   Cardiovascular:  Negative for chest pain, palpitations and leg swelling.  Gastrointestinal:  Negative for abdominal pain, blood in stool, constipation, diarrhea, nausea and vomiting.  Genitourinary: Negative.  Negative for difficulty urinating.       +ED  Musculoskeletal: Negative.  Negative for arthralgias.  Skin: Negative.  Negative for color change.  Neurological: Negative.  Negative for dizziness, light-headedness and numbness.  Hematological:  Negative for adenopathy. Does not bruise/bleed easily.  Psychiatric/Behavioral: Negative.      Objective:  BP 138/82 (BP Location: Left Arm, Patient Position: Sitting, Cuff Size: Large)   Pulse 66   Temp 98.5 F (36.9 C) (Oral)   Ht 5\' 7"  (1.702 m)   Wt 229 lb (103.9 kg)   SpO2 95%   BMI 35.87 kg/m   BP Readings from Last 3 Encounters:  09/20/22 138/82  02/10/22 127/69  02/04/22 (!) 139/99    Wt Readings from Last 3 Encounters:  09/20/22 229 lb (103.9 kg)  02/10/22 236 lb (107 kg)  01/21/22 236 lb (107 kg)    Physical  Exam Vitals reviewed.  HENT:     Nose: Nose normal.     Mouth/Throat:     Mouth: Mucous membranes are moist.  Eyes:     General: No scleral icterus.    Conjunctiva/sclera: Conjunctivae normal.  Cardiovascular:     Rate and Rhythm: Normal rate and regular rhythm.     Heart sounds: No murmur heard. Pulmonary:     Effort: Pulmonary effort is normal.     Breath sounds: No stridor. No wheezing, rhonchi or rales.  Abdominal:     General: Abdomen is flat.     Palpations: There is no mass.     Tenderness: There is no abdominal tenderness. There is no guarding.     Hernia: No hernia is present. There is no hernia in the left inguinal area or right inguinal area.  Genitourinary:    Pubic Area: No rash.      Penis: Normal and circumcised.      Testes: Normal.     Epididymis:     Right: Normal.     Left: Normal.     Prostate: Normal. Not enlarged, not tender and no nodules present.     Rectum: Normal. Guaiac result negative. No mass, tenderness, anal fissure, external hemorrhoid or internal hemorrhoid. Normal anal tone.  Musculoskeletal:        General: Normal range of motion.     Cervical back: Neck supple.     Right lower leg: No edema.  Left lower leg: No edema.  Lymphadenopathy:     Cervical: No cervical adenopathy.     Lower Body: No right inguinal adenopathy. No left inguinal adenopathy.  Skin:    General: Skin is warm and dry.  Neurological:     Mental Status: He is alert. Mental status is at baseline.     Motor: Weakness present.     Gait: Gait normal.  Psychiatric:        Mood and Affect: Mood normal.        Behavior: Behavior normal.     Lab Results  Component Value Date   WBC 5.5 09/20/2022   HGB 14.2 09/20/2022   HCT 41.7 09/20/2022   PLT 254.0 09/20/2022   GLUCOSE 82 09/20/2022   CHOL 167 05/27/2021   TRIG 93.0 05/27/2021   HDL 67.20 05/27/2021   LDLCALC 81 05/27/2021   ALT 19 09/20/2022   AST 20 09/20/2022   NA 141 09/20/2022   K 4.1 09/20/2022    CL 104 09/20/2022   CREATININE 1.18 09/20/2022   BUN 11 09/20/2022   CO2 29 09/20/2022   TSH 1.87 09/20/2022   PSA 0.73 09/20/2022   HGBA1C 5.1 05/27/2021    No results found.  Assessment & Plan:   Kyle Donovan was seen today for annual exam, hypertension and gastroesophageal reflux.  Diagnoses and all orders for this visit:  Essential hypertension, benign- His blood pressure is adequately well-controlled. -     nebivolol (BYSTOLIC) 10 MG tablet; Take 1 tablet (10 mg total) by mouth daily. -     TSH; Future -     Urinalysis, Routine w reflex microscopic; Future -     Hepatic function panel; Future -     Basic metabolic panel; Future -     Basic metabolic panel -     Hepatic function panel -     Urinalysis, Routine w reflex microscopic -     TSH  Gastroesophageal reflux disease, unspecified whether esophagitis present- He is asymptomatic with this. -     CBC with Differential/Platelet; Future -     CBC with Differential/Platelet  Erectile dysfunction due to arterial insufficiency- Labs are negative for secondary causes. -     TSH; Future -     sildenafil (REVATIO) 20 MG tablet; Take 4 tablets (80 mg total) by mouth daily as needed. -     Testosterone Total,Free,Bio, Males; Future -     Testosterone Total,Free,Bio, Males -     TSH  Encounter for general adult medical examination with abnormal findings- Exam completed, labs reviewed, vaccines reviewed and updated, cancer screenings are up-to-date, patient education was given. -     PSA; Future -     PSA  Other orders -     Tdap vaccine greater than or equal to 7yo IM -     Zoster Recombinant (Shingrix )   I have changed Kyle Donovan nebivolol. I am also having him start on sildenafil. Additionally, I am having him maintain his albuterol.  Meds ordered this encounter  Medications   nebivolol (BYSTOLIC) 10 MG tablet    Sig: Take 1 tablet (10 mg total) by mouth daily.    Dispense:  90 tablet    Refill:  1    sildenafil (REVATIO) 20 MG tablet    Sig: Take 4 tablets (80 mg total) by mouth daily as needed.    Dispense:  60 tablet    Refill:  3     Follow-up: Return in  about 6 months (around 03/21/2023).  Scarlette Calico, MD

## 2022-09-21 LAB — TESTOSTERONE TOTAL,FREE,BIO, MALES
Albumin: 4.5 g/dL (ref 3.6–5.1)
Sex Hormone Binding: 29 nmol/L (ref 10–50)
Testosterone, Bioavailable: 97.9 ng/dL — ABNORMAL LOW (ref 110.0–575.0)
Testosterone, Free: 47.6 pg/mL (ref 46.0–224.0)
Testosterone: 335 ng/dL (ref 250–827)

## 2023-05-25 ENCOUNTER — Other Ambulatory Visit: Payer: Self-pay | Admitting: Internal Medicine

## 2023-05-25 DIAGNOSIS — I10 Essential (primary) hypertension: Secondary | ICD-10-CM

## 2023-06-02 ENCOUNTER — Telehealth: Payer: Self-pay | Admitting: Internal Medicine

## 2023-06-02 ENCOUNTER — Other Ambulatory Visit: Payer: Self-pay | Admitting: Internal Medicine

## 2023-06-02 DIAGNOSIS — I10 Essential (primary) hypertension: Secondary | ICD-10-CM

## 2023-06-02 MED ORDER — NEBIVOLOL HCL 10 MG PO TABS: 10.0000 mg | ORAL_TABLET | Freq: Every day | ORAL | 0 refills | Status: DC

## 2023-06-02 NOTE — Telephone Encounter (Signed)
Patient is scheduled for 06/14/2023. He said he is out of medication and would like to know if he can get enough to last until that appointment.   Prescription Request  06/02/2023  LOV: 09/20/2022  What is the name of the medication or equipment?  nebivolol (BYSTOLIC) 10 MG tablet  Have you contacted your pharmacy to request a refill? Yes   Which pharmacy would you like this sent to?  [id number] - [place], [place] - [address] AT [facility] [address] [place] [address] Phone: [phone] Fax: [phone]    Patient notified that their request is being sent to the clinical staff for review and that they should receive a response within 2 business days.   Please advise at Mobile [phone] (mobile)

## 2023-06-14 ENCOUNTER — Encounter: Payer: Self-pay | Admitting: Internal Medicine

## 2023-06-14 ENCOUNTER — Ambulatory Visit: Payer: 59 | Admitting: Internal Medicine

## 2023-06-14 VITALS — BP 128/80 | HR 61 | Temp 97.9°F | Resp 16 | Ht 67.0 in | Wt 228.4 lb

## 2023-06-14 DIAGNOSIS — Z23 Encounter for immunization: Secondary | ICD-10-CM

## 2023-06-14 DIAGNOSIS — I10 Essential (primary) hypertension: Secondary | ICD-10-CM | POA: Diagnosis not present

## 2023-06-14 DIAGNOSIS — R739 Hyperglycemia, unspecified: Secondary | ICD-10-CM

## 2023-06-14 MED ORDER — NEBIVOLOL HCL 10 MG PO TABS: 10.0000 mg | ORAL_TABLET | Freq: Every day | ORAL | 0 refills | Status: DC

## 2023-06-14 NOTE — Patient Instructions (Signed)
Hypertension, Adult High blood pressure (hypertension) is when the force of blood pumping through the arteries is too strong. The arteries are the blood vessels that carry blood from the heart throughout the body. Hypertension forces the heart to work harder to pump blood and may cause arteries to become narrow or stiff. Untreated or uncontrolled hypertension can lead to a heart attack, heart failure, a stroke, kidney disease, and other problems. A blood pressure reading consists of a higher number over a lower number. Ideally, your blood pressure should be below 120/80. The first ("top") number is called the systolic pressure. It is a measure of the pressure in your arteries as your heart beats. The second ("bottom") number is called the diastolic pressure. It is a measure of the pressure in your arteries as the heart relaxes. What are the causes? The exact cause of this condition is not known. There are some conditions that result in high blood pressure. What increases the risk? Certain factors may make you more likely to develop high blood pressure. Some of these risk factors are under your control, including: Smoking. Not getting enough exercise or physical activity. Being overweight. Having too much fat, sugar, calories, or salt (sodium) in your diet. Drinking too much alcohol. Other risk factors include: Having a personal history of heart disease, diabetes, high cholesterol, or kidney disease. Stress. Having a family history of high blood pressure and high cholesterol. Having obstructive sleep apnea. Age. The risk increases with age. What are the signs or symptoms? High blood pressure may not cause symptoms. Very high blood pressure (hypertensive crisis) may cause: Headache. Fast or irregular heartbeats (palpitations). Shortness of breath. Nosebleed. Nausea and vomiting. Vision changes. Severe chest pain, dizziness, and seizures. How is this diagnosed? This condition is diagnosed by  measuring your blood pressure while you are seated, with your arm resting on a flat surface, your legs uncrossed, and your feet flat on the floor. The cuff of the blood pressure monitor will be placed directly against the skin of your upper arm at the level of your heart. Blood pressure should be measured at least twice using the same arm. Certain conditions can cause a difference in blood pressure between your right and left arms. If you have a high blood pressure reading during one visit or you have normal blood pressure with other risk factors, you may be asked to: Return on a different day to have your blood pressure checked again. Monitor your blood pressure at home for 1 week or longer. If you are diagnosed with hypertension, you may have other blood or imaging tests to help your health care provider understand your overall risk for other conditions. How is this treated? This condition is treated by making healthy lifestyle changes, such as eating healthy foods, exercising more, and reducing your alcohol intake. You may be referred for counseling on a healthy diet and physical activity. Your health care provider may prescribe medicine if lifestyle changes are not enough to get your blood pressure under control and if: Your systolic blood pressure is above 130. Your diastolic blood pressure is above 80. Your personal target blood pressure may vary depending on your medical conditions, your age, and other factors. Follow these instructions at home: Eating and drinking  Eat a diet that is high in fiber and potassium, and low in sodium, added sugar, and fat. An example of this eating plan is called the DASH diet. DASH stands for Dietary Approaches to Stop Hypertension. To eat this way: Eat   plenty of fresh fruits and vegetables. Try to fill one half of your plate at each meal with fruits and vegetables. Eat whole grains, such as whole-wheat pasta, brown rice, or whole-grain bread. Fill about one  fourth of your plate with whole grains. Eat or drink low-fat dairy products, such as skim milk or low-fat yogurt. Avoid fatty cuts of meat, processed or cured meats, and poultry with skin. Fill about one fourth of your plate with lean proteins, such as fish, chicken without skin, beans, eggs, or tofu. Avoid pre-made and processed foods. These tend to be higher in sodium, added sugar, and fat. Reduce your daily sodium intake. Many people with hypertension should eat less than 1,500 mg of sodium a day. Do not drink alcohol if: Your health care provider tells you not to drink. You are pregnant, may be pregnant, or are planning to become pregnant. If you drink alcohol: Limit how much you have to: 0-1 drink a day for women. 0-2 drinks a day for men. Know how much alcohol is in your drink. In the [place] one drink equals one 12 oz bottle of beer (355 mL), one 5 oz glass of wine (148 mL), or one 1 oz glass of hard liquor (44 mL). Lifestyle  Work with your health care provider to maintain a healthy body weight or to lose weight. Ask what an ideal weight is for you. Get at least 30 minutes of exercise that causes your heart to beat faster (aerobic exercise) most days of the week. Activities may include walking, swimming, or biking. Include exercise to strengthen your muscles (resistance exercise), such as Pilates or lifting weights, as part of your weekly exercise routine. Try to do these types of exercises for 30 minutes at least 3 days a week. Do not use any products that contain nicotine or tobacco. These products include cigarettes, chewing tobacco, and vaping devices, such as e-cigarettes. If you need help quitting, ask your health care provider. Monitor your blood pressure at home as told by your health care provider. Keep all follow-up visits. This is important. Medicines Take over-the-counter and prescription medicines only as told by your health care provider. Follow directions carefully. Blood  pressure medicines must be taken as prescribed. Do not skip doses of blood pressure medicine. Doing this puts you at risk for problems and can make the medicine less effective. Ask your health care provider about side effects or reactions to medicines that you should watch for. Contact a health care provider if you: Think you are having a reaction to a medicine you are taking. Have headaches that keep coming back (recurring). Feel dizzy. Have swelling in your ankles. Have trouble with your vision. Get help right away if you: Develop a severe headache or confusion. Have unusual weakness or numbness. Feel faint. Have severe pain in your chest or abdomen. Vomit repeatedly. Have trouble breathing. These symptoms may be an emergency. Get help right away. Call 911. Do not wait to see if the symptoms will go away. Do not drive yourself to the hospital. Summary Hypertension is when the force of blood pumping through your arteries is too strong. If this condition is not controlled, it may put you at risk for serious complications. Your personal target blood pressure may vary depending on your medical conditions, your age, and other factors. For most people, a normal blood pressure is less than 120/80. Hypertension is treated with lifestyle changes, medicines, or a combination of both. Lifestyle changes include losing weight, eating a healthy,   low-sodium diet, exercising more, and limiting alcohol. This information is not intended to replace advice given to you by your health care provider. Make sure you discuss any questions you have with your health care provider. Document Revised: 06/15/2021 Document Reviewed: 06/15/2021 Elsevier Patient Education  2024 [employer]vier Inc[employer]  

## 2023-06-14 NOTE — Progress Notes (Signed)
Subjective:  Patient ID: Kyle Donovan, male    DOB: 22-Jan-1971  Age: 52 y.o. MRN: [id number]  CC: Hypertension   HPI Kyle Donovan presents for f/up ---  Discussed the use of AI scribe software for clinical note transcription with the patient, who gave verbal consent to proceed.  History of Present Illness   The patient, currently on antihypertensive medication, denies experiencing chest pain, shortness of breath, dizziness, lightheadedness, or feelings of a low heart rate. 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 He requested a refill of his blood pressure medication, which he has been taking regularly.       Outpatient Medications Prior to Visit  Medication Sig Dispense Refill   albuterol (VENTOLIN HFA) 108 (90 Base) MCG/ACT inhaler Inhale 2 puffs into the lungs every 6 (six) hours as needed for wheezing or shortness of breath.     nebivolol (BYSTOLIC) 10 MG tablet Take 1 tablet (10 mg total) by mouth daily. 30 tablet 0   sildenafil (REVATIO) 20 MG tablet Take 4 tablets (80 mg total) by mouth daily as needed. 60 tablet 3   No facility-administered medications prior to visit.    ROS Review of Systems  Constitutional: Negative.  Negative for diaphoresis and fatigue.  HENT: Negative.    Eyes: Negative.   Respiratory: Negative.  Negative for cough, chest tightness, shortness of breath and wheezing.   Cardiovascular:  Negative for chest pain, palpitations and leg swelling.  Gastrointestinal:  Negative for abdominal pain and nausea.  Genitourinary: Negative.  Negative for difficulty urinating.  Musculoskeletal:  Negative for arthralgias and neck stiffness.  Skin: Negative.   Neurological: Negative.  Negative for dizziness and weakness.  Hematological:  Negative for adenopathy. Does not bruise/bleed easily.  Psychiatric/Behavioral: Negative.      Objective:  BP 128/80 (BP Location: Left Arm, Patient  Position: Sitting, Cuff Size: Normal)   Pulse 61   Temp 97.9 F (36.6 C) (Oral)   Resp 16   Ht 5\' 7"  (1.702 m)   Wt 228 lb 6.4 oz (103.6 kg)   SpO2 96%   BMI 35.77 kg/m   BP Readings from Last 3 Encounters:  06/14/23 128/80  09/20/22 138/82  02/10/22 127/69    Wt Readings from Last 3 Encounters:  06/14/23 228 lb 6.4 oz (103.6 kg)  09/20/22 229 lb (103.9 kg)  02/10/22 236 lb (107 kg)    Physical Exam Vitals reviewed.  Constitutional:      Appearance: Normal appearance.  HENT:     Nose: Nose normal.     Mouth/Throat:     Mouth: Mucous membranes are moist.  Eyes:     General: No scleral icterus.    Conjunctiva/sclera: Conjunctivae normal.  Cardiovascular:     Rate and Rhythm: Normal rate and regular rhythm.     Heart sounds: No murmur heard.    No gallop.  Pulmonary:     Effort: Pulmonary effort is normal.     Breath sounds: No stridor. No wheezing, rhonchi or rales.  Abdominal:     General: Abdomen is flat.     Palpations: There is no mass.     Tenderness: There is no abdominal tenderness. There is no guarding.     Hernia: No hernia is present.  Musculoskeletal:        General: Normal range of motion.     Cervical back: Neck supple.     Right lower leg: No  edema.     Left lower leg: No edema.  Lymphadenopathy:     Cervical: No cervical adenopathy.  Skin:    General: Skin is warm and dry.  Neurological:     General: No focal deficit present.     Mental Status: He is alert. Mental status is at baseline.  Psychiatric:        Mood and Affect: Mood normal.        Behavior: Behavior normal.     Lab Results  Component Value Date   WBC 5.5 09/20/2022   HGB 14.2 09/20/2022   HCT 41.7 09/20/2022   PLT 254.0 09/20/2022   GLUCOSE 82 09/20/2022   CHOL 167 05/27/2021   TRIG 93.0 05/27/2021   HDL 67.20 05/27/2021   LDLCALC 81 05/27/2021   ALT 19 09/20/2022   AST 20 09/20/2022   NA 141 09/20/2022   K 4.1 09/20/2022   CL 104 09/20/2022   CREATININE 1.18  09/20/2022   BUN 11 09/20/2022   CO2 29 09/20/2022   TSH 1.87 09/20/2022   PSA 0.73 09/20/2022   HGBA1C 5.1 05/27/2021    No results found.  Assessment & Plan:  Need for immunization against influenza -     Flu vaccine trivalent PF, 6mos and older(Flulaval,Afluria,Fluarix,Fluzone)  Essential hypertension, benign - His BP is well controlled.  -     Nebivolol HCl; Take 1 tablet (10 mg total) by mouth daily.  Dispense: 90 tablet; Refill: 0     Follow-up: Return in about 4 months (around 10/15/2023).  Sanda Linger, MD

## 2023-06-19 DIAGNOSIS — Z23 Encounter for immunization: Secondary | ICD-10-CM | POA: Insufficient documentation

## 2023-07-06 ENCOUNTER — Other Ambulatory Visit: Payer: Self-pay | Admitting: Internal Medicine

## 2023-07-06 DIAGNOSIS — I10 Essential (primary) hypertension: Secondary | ICD-10-CM

## 2023-08-12 ENCOUNTER — Other Ambulatory Visit: Payer: Self-pay | Admitting: Internal Medicine

## 2023-08-12 DIAGNOSIS — I10 Essential (primary) hypertension: Secondary | ICD-10-CM

## 2023-11-12 ENCOUNTER — Encounter (HOSPITAL_COMMUNITY): Payer: Self-pay

## 2023-11-12 ENCOUNTER — Other Ambulatory Visit: Payer: Self-pay

## 2023-11-12 ENCOUNTER — Emergency Department (HOSPITAL_COMMUNITY)
Admission: EM | Admit: 2023-11-12 | Discharge: 2023-11-12 | Disposition: A | Discharge status: Home / Self Care | Attending: Emergency Medicine | Admitting: Emergency Medicine

## 2023-11-12 DIAGNOSIS — W44F3XA Food entering into or through a natural orifice, initial encounter: Secondary | ICD-10-CM | POA: Diagnosis not present

## 2023-11-12 DIAGNOSIS — T18128A Food in esophagus causing other injury, initial encounter: Secondary | ICD-10-CM | POA: Insufficient documentation

## 2023-11-12 LAB — CBC WITH DIFFERENTIAL/PLATELET
Abs Immature Granulocytes: 0.01 10*3/uL (ref 0.00–0.07)
Basophils Absolute: 0 10*3/uL (ref 0.0–0.1)
Basophils Relative: 1 %
Eosinophils Absolute: 0.5 10*3/uL (ref 0.0–0.5)
Eosinophils Relative: 9 %
HCT: 45.1 % (ref 39.0–52.0)
Hemoglobin: 14.5 g/dL (ref 13.0–17.0)
Immature Granulocytes: 0 %
Lymphocytes Relative: 36 %
Lymphs Abs: 2 10*3/uL (ref 0.7–4.0)
MCH: 31.3 pg (ref 26.0–34.0)
MCHC: 32.2 g/dL (ref 30.0–36.0)
MCV: 97.2 fL (ref 80.0–100.0)
Monocytes Absolute: 0.4 10*3/uL (ref 0.1–1.0)
Monocytes Relative: 7 %
Neutro Abs: 2.6 10*3/uL (ref 1.7–7.7)
Neutrophils Relative %: 47 %
Platelets: 245 10*3/uL (ref 150–400)
RBC: 4.64 MIL/uL (ref 4.22–5.81)
RDW: 12.1 % (ref 11.5–15.5)
WBC: 5.4 10*3/uL (ref 4.0–10.5)
nRBC: 0 % (ref 0.0–0.2)

## 2023-11-12 LAB — COMPREHENSIVE METABOLIC PANEL
ALT: 36 U/L (ref 0–44)
AST: 25 U/L (ref 15–41)
Albumin: 4.2 g/dL (ref 3.5–5.0)
Alkaline Phosphatase: 53 U/L (ref 38–126)
Anion gap: 8 (ref 5–15)
BUN: 14 mg/dL (ref 6–20)
CO2: 27 mmol/L (ref 22–32)
Calcium: 10 mg/dL (ref 8.9–10.3)
Chloride: 108 mmol/L (ref 98–111)
Creatinine, Ser: 1.35 mg/dL — ABNORMAL HIGH (ref 0.61–1.24)
GFR, Estimated: >60 mL/min (ref >60)
Glucose, Bld: 100 mg/dL — ABNORMAL HIGH (ref 70–99)
Potassium: 4.1 mmol/L (ref 3.5–5.1)
Sodium: 143 mmol/L (ref 135–145)
Total Bilirubin: 0.8 mg/dL (ref 0.0–1.2)
Total Protein: 8.2 g/dL — ABNORMAL HIGH (ref 6.5–8.1)

## 2023-11-12 LAB — CBG MONITORING, ED
Glucose-Capillary: 115 mg/dL — ABNORMAL HIGH (ref 70–99)
Glucose-Capillary: 76 mg/dL (ref 70–99)

## 2023-11-12 MED ORDER — LACTATED RINGERS IV BOLUS
1000.0000 mL | Freq: Once | INTRAVENOUS | Status: AC
  Administered 2023-11-12: 1000 mL via INTRAVENOUS

## 2023-11-12 MED ORDER — GLUCAGON HCL RDNA (DIAGNOSTIC) 1 MG IJ SOLR
1.0000 mg | Freq: Once | INTRAMUSCULAR | Status: AC
  Administered 2023-11-12: 1 mg via INTRAVENOUS
  Filled 2023-11-12: qty 1

## 2023-11-12 NOTE — Discharge Instructions (Addendum)
 You were seen today for food bolus.  It was fixed with medication. We need to call your gastroenterologist.  Please use only a soft diet as attached including were seen by gastroenterology.

## 2023-11-12 NOTE — ED Notes (Signed)
 Spoke to pharmacy to confirm giving glucagen IV along with Dr. Doran Durand. Both confirmed.

## 2023-11-12 NOTE — ED Triage Notes (Signed)
 Pt states has stew beef stuck in throat. States this has happened before. Hx of Genella Rife. Denies shob. Patient speaking in full sentences. No obvious distress.

## 2023-11-12 NOTE — ED Provider Notes (Signed)
  EMERGENCY DEPARTMENT AT [facility] Provider Note   CSN: [id number] Arrival date & time: 11/12/23  1449     History No chief complaint on file.   HPI Kyle Donovan is a 53 y.o. male presenting for chief complaint of food bolus. States that he ate a piece of roast beef approximately an hour prior to arrival.  He immediately felt to get stuck.  Has a history of similar that required endoscopy.  He denies fevers chills nausea vomiting syncope shortness of breath at this time.  Has been unable to keep any fluids down in the interim.  Patient's recorded medical, surgical, social, medication list and allergies were reviewed in the Snapshot window as part of the initial history.   Review of Systems   Review of Systems  Constitutional:  Negative for chills and fever.  HENT:  Positive for trouble swallowing. Negative for ear pain and sore throat.   Eyes:  Negative for pain and visual disturbance.  Respiratory:  Negative for cough and shortness of breath.   Cardiovascular:  Negative for chest pain and palpitations.  Gastrointestinal:  Positive for vomiting. Negative for abdominal pain.  Genitourinary:  Negative for dysuria and hematuria.  Musculoskeletal:  Negative for arthralgias and back pain.  Skin:  Negative for color change and rash.  Neurological:  Negative for seizures and syncope.  All other systems reviewed and are negative.   Physical Exam Updated Vital Signs BP (!) 150/110   Pulse 65   Temp 99 F (37.2 C) (Oral)   Resp 18   Ht 5\' 7"  (1.702 m)   Wt 103.6 kg   SpO2 97%   BMI 35.77 kg/m  Physical Exam Vitals and nursing note reviewed.  Constitutional:      General: He is not in acute distress.    Appearance: He is well-developed.  HENT:     Head: Normocephalic and atraumatic.  Eyes:     Conjunctiva/sclera: Conjunctivae normal.  Cardiovascular:     Rate and Rhythm: Normal rate and regular rhythm.     Heart sounds: No murmur  heard. Pulmonary:     Effort: Pulmonary effort is normal. No respiratory distress.     Breath sounds: Normal breath sounds.  Abdominal:     Palpations: Abdomen is soft.     Tenderness: There is no abdominal tenderness.  Musculoskeletal:        General: No swelling.     Cervical back: Neck supple.  Skin:    General: Skin is warm and dry.     Capillary Refill: Capillary refill takes less than 2 seconds.  Neurological:     Mental Status: He is alert.  Psychiatric:        Mood and Affect: Mood normal.      ED Course/ Medical Decision Making/ A&P    Procedures Procedures   Medications Ordered in ED Medications  glucagon (human recombinant) (GLUCAGEN) injection 1 mg (1 mg Intravenous Given 11/12/23 1526)  lactated ringers bolus 1,000 mL (1,000 mLs Intravenous New Bag/Given 11/12/23 1523)    Medical Decision Making:    Kyle Donovan is a 53 y.o. male who presented to the ED today with difficulty swallowing secondary to food bolus detailed above.     Patient placed on continuous vitals and telemetry monitoring while in ED which was reviewed periodically.   Complete initial physical exam performed, notably the patient  was hemodynamically stable no acute distress.      Reviewed and confirmed nursing documentation  for past medical history, family history, social history.    Initial Assessment:   Patient presenting with acute food bolus impaction. Will trial glucagon, IV fluids and observation to see if symptoms will resolve. Will also check for acute metabolic crisis causing his symptoms including hematologic or metabolic etiology of sepsis accepted Reassessment and Plan:   Labs resulted with no acute pathology. Glucose being monitored after glucagon. Patient endorsed improvement in syndrome. Given a p.o. fluid bolus and able to tolerate intake without difficulty   Disposition:  I have considered need for hospitalization, however, considering all of the above, I  believe this patient is stable for discharge at this time.  Patient/family educated about specific return precautions for given chief complaint and symptoms.  Patient/family educated about follow-up with PCP/Gastro.     Patient/family expressed understanding of return precautions and need for follow-up. Patient spoken to regarding all imaging and laboratory results and appropriate follow up for these results. All education provided in verbal form with additional information in written form. Time was allowed for answering of patient questions. Patient discharged.    Emergency Department Medication Summary:   Medications  glucagon (human recombinant) (GLUCAGEN) injection 1 mg (1 mg Intravenous Given 11/12/23 1526)  lactated ringers bolus 1,000 mL (1,000 mLs Intravenous New Bag/Given 11/12/23 1523)         Clinical Impression:  1. Esophageal obstruction due to food impaction      Discharge   Final Clinical Impression(s) / ED Diagnoses Final diagnoses:  Esophageal obstruction due to food impaction    Rx / DC Orders ED Discharge Orders     None         Glyn Ade, MD 11/12/23 [phone]

## 2024-02-06 ENCOUNTER — Telehealth: Payer: Self-pay | Admitting: Internal Medicine

## 2024-02-06 DIAGNOSIS — I10 Essential (primary) hypertension: Secondary | ICD-10-CM

## 2024-02-06 NOTE — Telephone Encounter (Signed)
 Copied from CRM [id number]. Topic: Clinical - Medication Question >> Feb 06, 2024  1:47 PM Jenice Mitts wrote: Reason for CRM: Patient is wondering since his appointment is scheduled can his medication be sent in to last until the appointment since he is out  nebivolol  (BYSTOLIC ) 10 MG tablet [Pharmacy Med Name: NEBIVOLOL  10 MG TABLET]

## 2024-02-07 ENCOUNTER — Other Ambulatory Visit: Payer: Self-pay

## 2024-02-07 DIAGNOSIS — I10 Essential (primary) hypertension: Secondary | ICD-10-CM

## 2024-02-07 MED ORDER — NEBIVOLOL HCL 10 MG PO TABS: 10.0000 mg | ORAL_TABLET | Freq: Every day | ORAL | 0 refills | Status: DC

## 2024-02-07 NOTE — Telephone Encounter (Signed)
 Medication sent in.

## 2024-03-07 ENCOUNTER — Ambulatory Visit: Admitting: Internal Medicine

## 2024-03-07 ENCOUNTER — Encounter: Payer: Self-pay | Admitting: Internal Medicine

## 2024-03-07 VITALS — BP 142/82 | HR 63 | Temp 98.5°F | Resp 16 | Ht 67.0 in | Wt 233.2 lb

## 2024-03-07 DIAGNOSIS — E785 Hyperlipidemia, unspecified: Secondary | ICD-10-CM | POA: Diagnosis not present

## 2024-03-07 DIAGNOSIS — L2084 Intrinsic (allergic) eczema: Secondary | ICD-10-CM

## 2024-03-07 DIAGNOSIS — Z23 Encounter for immunization: Secondary | ICD-10-CM | POA: Diagnosis not present

## 2024-03-07 DIAGNOSIS — Z0000 Encounter for general adult medical examination without abnormal findings: Secondary | ICD-10-CM | POA: Diagnosis not present

## 2024-03-07 DIAGNOSIS — I10 Essential (primary) hypertension: Secondary | ICD-10-CM | POA: Diagnosis not present

## 2024-03-07 DIAGNOSIS — Z0001 Encounter for general adult medical examination with abnormal findings: Secondary | ICD-10-CM

## 2024-03-07 LAB — BASIC METABOLIC PANEL WITH GFR
BUN: 12 mg/dL (ref 6–23)
CO2: 31 meq/L (ref 19–32)
Calcium: 9.6 mg/dL (ref 8.4–10.5)
Chloride: 105 meq/L (ref 96–112)
Creatinine, Ser: 1.19 mg/dL (ref 0.40–1.50)
GFR: 70.37 mL/min (ref >60.00)
Glucose, Bld: 93 mg/dL (ref 70–99)
Potassium: 4.2 meq/L (ref 3.5–5.1)
Sodium: 143 meq/L (ref 135–145)

## 2024-03-07 LAB — URINALYSIS, ROUTINE W REFLEX MICROSCOPIC
Bilirubin Urine: NEGATIVE
Hgb urine dipstick: NEGATIVE
Ketones, ur: NEGATIVE
Leukocytes,Ua: NEGATIVE
Nitrite: NEGATIVE
RBC / HPF: NONE SEEN (ref 0–?)
Specific Gravity, Urine: 1.025 (ref 1.000–1.030)
Total Protein, Urine: NEGATIVE
Urine Glucose: NEGATIVE
Urobilinogen, UA: 1 (ref 0.0–1.0)
pH: 6 (ref 5.0–8.0)

## 2024-03-07 LAB — LIPID PANEL
Cholesterol: 163 mg/dL (ref 0–200)
HDL: 67.2 mg/dL (ref >39.00)
LDL Cholesterol: 83 mg/dL (ref 0–99)
NonHDL: 95.51
Total CHOL/HDL Ratio: 2
Triglycerides: 65 mg/dL (ref 0.0–149.0)
VLDL: 13 mg/dL (ref 0.0–40.0)

## 2024-03-07 LAB — PSA: PSA: 0.73 ng/mL (ref 0.10–4.00)

## 2024-03-07 LAB — TSH: TSH: 2.44 u[IU]/mL (ref 0.35–5.50)

## 2024-03-07 MED ORDER — ZORYVE 0.15 % EX CREA: 1.0000 | TOPICAL_CREAM | Freq: Every day | CUTANEOUS | 3 refills | Status: AC

## 2024-03-07 NOTE — Patient Instructions (Signed)
 Health Maintenance, Male  Adopting a healthy lifestyle and getting preventive care are important in promoting health and wellness. Ask your health care provider about:  The right schedule for you to have regular tests and exams.  Things you can do on your own to prevent diseases and keep yourself healthy.  What should I know about diet, weight, and exercise?  Eat a healthy diet    Eat a diet that includes plenty of vegetables, fruits, low-fat dairy products, and lean protein.  Do not eat a lot of foods that are high in solid fats, added sugars, or sodium.  Maintain a healthy weight  Body mass index (BMI) is a measurement that can be used to identify possible weight problems. It estimates body fat based on height and weight. Your health care provider can help determine your BMI and help you achieve or maintain a healthy weight.  Get regular exercise  Get regular exercise. This is one of the most important things you can do for your health. Most adults should:  Exercise for at least 150 minutes each week. The exercise should increase your heart rate and make you sweat (moderate-intensity exercise).  Do strengthening exercises at least twice a week. This is in addition to the moderate-intensity exercise.  Spend less time sitting. Even light physical activity can be beneficial.  Watch cholesterol and blood lipids  Have your blood tested for lipids and cholesterol at 52 years of age, then have this test every 5 years.  You may need to have your cholesterol levels checked more often if:  Your lipid or cholesterol levels are high.  You are older than 52 years of age.  You are at high risk for heart disease.  What should I know about cancer screening?  Many types of cancers can be detected early and may often be prevented. Depending on your health history and family history, you may need to have cancer screening at various ages. This may include screening for:  Colorectal cancer.  Prostate cancer.  Skin cancer.  Lung  cancer.  What should I know about heart disease, diabetes, and high blood pressure?  Blood pressure and heart disease  High blood pressure causes heart disease and increases the risk of stroke. This is more likely to develop in people who have high blood pressure readings or are overweight.  Talk with your health care provider about your target blood pressure readings.  Have your blood pressure checked:  Every 3-5 years if you are 52-95 years of age.  Every year if you are 85 years old or older.  If you are between the ages of 29 and 29 and are a current or former smoker, ask your health care provider if you should have a one-time screening for abdominal aortic aneurysm (AAA).  Diabetes  Have regular diabetes screenings. This checks your fasting blood sugar level. Have the screening done:  Once every three years after age 23 if you are at 52 years of age, if you are at a normal weight and have a low risk for diabetes.  More often and at a younger age if you are overweight or have a high risk for diabetes.  What should I know about preventing infection?  Hepatitis B  If you have a higher risk for hepatitis B, you should be screened for this virus. Talk with your health care provider to find out if you are at risk for hepatitis B infection.  Hepatitis C  Blood testing is recommended for:  Everyone born from [birth year] through 1964.  Anyone  with known risk factors for hepatitis C.  Sexually transmitted infections (STIs)  You should be screened each year for STIs, including gonorrhea and chlamydia, if:  You are sexually active and are younger than 52 years of age.  You are older than 52 years of age and your health care provider tells you that you are at risk for this type of infection.  Your sexual activity has changed since you were last screened, and you are at increased risk for chlamydia or gonorrhea. Ask your health care provider if you are at risk.  Ask your health care provider about whether you are at high risk for HIV. Your health care provider  may recommend a prescription medicine to help prevent HIV infection. If you choose to take medicine to prevent HIV, you should first get tested for HIV. You should then be tested every 3 months for as long as you are taking the medicine.  Follow these instructions at home:  Alcohol use  Do not drink alcohol if your health care provider tells you not to drink.  If you drink alcohol:  Limit how much you have to 0-2 drinks a day.  Know how much alcohol is in your drink. In the [place] one drink equals one 12 oz bottle of beer (355 mL), one 5 oz glass of wine (148 mL), or one 1 oz glass of hard liquor (44 mL).  Lifestyle  Do not use any products that contain nicotine or tobacco. These products include cigarettes, chewing tobacco, and vaping devices, such as e-cigarettes. If you need help quitting, ask your health care provider.  Do not use street drugs.  Do not share needles.  Ask your health care provider for help if you need support or information about quitting drugs.  General instructions  Schedule regular health, dental, and eye exams.  Stay current with your vaccines.  Tell your health care provider if:  You often feel depressed.  You have ever been abused or do not feel safe at home.  Summary  Adopting a healthy lifestyle and getting preventive care are important in promoting health and wellness.  Follow your health care provider's instructions about healthy diet, exercising, and getting tested or screened for diseases.  Follow your health care provider's instructions on monitoring your cholesterol and blood pressure.  This information is not intended to replace advice given to you by your health care provider. Make sure you discuss any questions you have with your health care provider.  Document Revised: 12/28/2020 Document Reviewed: 12/28/2020  Elsevier Patient Education  2024 [employer].

## 2024-03-07 NOTE — Progress Notes (Unsigned)
 Subjective:  Patient ID: Kyle Donovan, male    DOB: [birth year]  Age: 53 y.o. MRN: [id number]  CC: Annual Exam, Hypertension, and Rash   HPI Kyle Donovan presents for a CPX and f/up ----  Discussed the use of AI scribe software for clinical note transcription with the patient, who gave verbal consent to proceed.  History of Present Illness   Kyle Donovan is a 53 year old male who presents for an annual physical exam.  He denies symptoms of high blood pressure such as headache, blurred vision, chest pain, shortness of breath, dizziness, or lightheadedness. His blood pressure was previously noted to be 136 mmHg, and he denies any current issues.  He experiences left LBP when sleeping, which he attributes to needing a new mattress. This discomfort occurs on Saturdays after work.  He has a rash on his back, described as being in the middle, which has been present for a couple of months. The rash itches, but he has not applied any treatment to it.  He is active, working two jobs, and denies any abdominal pain, chest pain, or shortness of breath.       Outpatient Medications Prior to Visit  Medication Sig Dispense Refill   albuterol  (VENTOLIN  HFA) 108 (90 Base) MCG/ACT inhaler Inhale 2 puffs into the lungs every 6 (six) hours as needed for wheezing or shortness of breath.     sildenafil  (REVATIO ) 20 MG tablet Take 4 tablets (80 mg total) by mouth daily as needed. 60 tablet 3   nebivolol  (BYSTOLIC ) 10 MG tablet Take 1 tablet (10 mg total) by mouth daily. 90 tablet 0   No facility-administered medications prior to visit.    ROS Review of Systems  Constitutional:  Negative for chills, diaphoresis and fatigue.  HENT: Negative.  Negative for trouble swallowing.   Eyes: Negative.   Respiratory: Negative.  Negative for cough, chest tightness, shortness of breath and wheezing.   Cardiovascular:  Negative for chest pain, palpitations and leg swelling.  Gastrointestinal:   Negative for abdominal pain, constipation, diarrhea, nausea and vomiting.  Genitourinary: Negative.  Negative for difficulty urinating.  Musculoskeletal: Negative.  Negative for myalgias.  Skin: Negative.   Neurological: Negative.  Negative for dizziness, weakness and light-headedness.  Hematological:  Negative for adenopathy. Does not bruise/bleed easily.  Psychiatric/Behavioral: Negative.      Objective:  BP (!) 142/82 (BP Location: Left Arm, Patient Position: Sitting, Cuff Size: Normal)   Pulse 63   Temp 98.5 F (36.9 C) (Oral)   Resp 16   Ht 5' 7 (1.702 m)   Wt 233 lb 3.2 oz (105.8 kg)   SpO2 96%   BMI 36.52 kg/m   BP Readings from Last 3 Encounters:  03/07/24 (!) 142/82  11/12/23 (!) 150/110  06/14/23 128/80    Wt Readings from Last 3 Encounters:  03/07/24 233 lb 3.2 oz (105.8 kg)  11/12/23 228 lb 6.3 oz (103.6 kg)  06/14/23 228 lb 6.4 oz (103.6 kg)    Physical Exam Vitals reviewed.  Constitutional:      Appearance: Normal appearance.  Eyes:     General: No scleral icterus.    Conjunctiva/sclera: Conjunctivae normal.  Cardiovascular:     Rate and Rhythm: Normal rate and regular rhythm.     Pulses:          Carotid pulses are 1+ on the right side and 1+ on the left side.      Radial pulses are 1+ on the right  side and 1+ on the left side.       Femoral pulses are 1+ on the right side and 1+ on the left side.    Heart sounds: No murmur heard.    No friction rub. No gallop.     Comments: EKG--- NSR, 60 bpm Minimal LVH NS ST/T wave changes No Q waves Unchanged  Pulmonary:     Effort: Pulmonary effort is normal.     Breath sounds: No stridor. No wheezing, rhonchi or rales.  Abdominal:     General: Abdomen is flat.     Palpations: There is no mass.     Tenderness: There is no abdominal tenderness.     Hernia: No hernia is present. There is no hernia in the left inguinal area or right inguinal area.  Genitourinary:    Pubic Area: No rash.      Penis:  Normal and circumcised.      Testes: Normal.     Epididymis:     Right: Normal.     Left: Normal.     Prostate: Normal. Not enlarged, not tender and no nodules present.     Rectum: Normal. Guaiac result negative. No mass, tenderness, anal fissure, external hemorrhoid or internal hemorrhoid. Normal anal tone.  Musculoskeletal:        General: No swelling.     Cervical back: Neck supple.     Right lower leg: No edema.     Left lower leg: No edema.  Lymphadenopathy:     Cervical: No cervical adenopathy.     Lower Body: No right inguinal adenopathy. No left inguinal adenopathy.  Skin:    Findings: Rash present. No erythema.  Neurological:     General: No focal deficit present.     Mental Status: He is alert. Mental status is at baseline.  Psychiatric:        Mood and Affect: Mood normal.        Behavior: Behavior normal.     Lab Results  Component Value Date   WBC 5.4 11/12/2023   HGB 14.5 11/12/2023   HCT 45.1 11/12/2023   PLT 245 11/12/2023   GLUCOSE 93 03/07/2024   CHOL 163 03/07/2024   TRIG 65.0 03/07/2024   HDL 67.20 03/07/2024   LDLCALC 83 03/07/2024   ALT 36 11/12/2023   AST 25 11/12/2023   NA 143 03/07/2024   K 4.2 03/07/2024   CL 105 03/07/2024   CREATININE 1.19 03/07/2024   BUN 12 03/07/2024   CO2 31 03/07/2024   TSH 2.44 03/07/2024   PSA 0.73 03/07/2024   HGBA1C 5.1 05/27/2021    No results found.  Assessment & Plan:  Encounter for general adult medical examination with abnormal findings- Exam completed, labs reviewed, vaccines reviewed and updated, cancer screenings addressed, pt ed material was given.  -     Lipid panel; Future -     PSA; Future  Essential hypertension, benign- EKG is positive for LVH. He has not achieved his BP goal. Will add lozol. -     TSH; Future -     Urinalysis, Routine w reflex microscopic; Future -     Basic metabolic panel with GFR; Future -     EKG 12-Lead -     Nebivolol  HCl; Take 1 tablet (10 mg total) by mouth  daily.  Dispense: 90 tablet; Refill: 1 -     Indapamide; Take 1 tablet (1.25 mg total) by mouth daily.  Dispense: 90 tablet; Refill: 1 -  AMB Referral VBCI Care Management  Immunization due -     Heplisav-B  (HepB-CPG) Vaccine -     Varicella-zoster vaccine IM  Intrinsic atopic dermatitis -     Zoryve ; Apply 1 Act topically daily.  Dispense: 60 g; Refill: 3  Dyslipidemia, goal LDL below 70- Will start a statin for CV risk reduction. -     Rosuvastatin Calcium; Take 1 tablet (5 mg total) by mouth daily.  Dispense: 90 tablet; Refill: 1     Follow-up: Return in about 6 months (around 09/07/2024).  Debby Molt, MD

## 2024-03-08 ENCOUNTER — Ambulatory Visit: Payer: Self-pay | Admitting: Internal Medicine

## 2024-03-08 DIAGNOSIS — E785 Hyperlipidemia, unspecified: Secondary | ICD-10-CM | POA: Insufficient documentation

## 2024-03-08 MED ORDER — ROSUVASTATIN CALCIUM 5 MG PO TABS: 5.0000 mg | ORAL_TABLET | Freq: Every day | ORAL | 1 refills | Status: AC

## 2024-03-08 MED ORDER — INDAPAMIDE 1.25 MG PO TABS: 1.2500 mg | ORAL_TABLET | Freq: Every day | ORAL | 1 refills | Status: AC

## 2024-03-08 MED ORDER — NEBIVOLOL HCL 10 MG PO TABS: 10.0000 mg | ORAL_TABLET | Freq: Every day | ORAL | 1 refills | Status: DC

## 2024-03-18 ENCOUNTER — Telehealth: Payer: Self-pay | Admitting: *Deleted

## 2024-03-18 NOTE — Progress Notes (Signed)
 Care Guide Pharmacy Note  03/18/2024 Name: [name] MRN: [id number] DOB: [birth year]  Referred By: Joshua Debby CROME, MD Reason for referral: Complex Care Management (Outreach to schedule referral with pharmacist )   Kyle Donovan is a 53 y.o. year old male who is a primary care patient of Joshua Debby CROME, MD.  Kyle Donovan was referred to the pharmacist for assistance related to: HTN  Successful contact was made with the patient to discuss pharmacy services including being ready for the pharmacist to call at least 5 minutes before the scheduled appointment time and to have medication bottles and any blood pressure readings ready for review. The patient agreed to meet with the pharmacist via telephone visit on 03/29/2024  Thedford Franks, CMA [place]  [facility], [facility] Guide Direct Dial: [phone]  Fax: [phone] Website: [email or web address]

## 2024-03-29 ENCOUNTER — Other Ambulatory Visit

## 2024-04-03 ENCOUNTER — Other Ambulatory Visit

## 2024-04-05 ENCOUNTER — Telehealth: Payer: Self-pay | Admitting: Pharmacist

## 2024-04-05 NOTE — Telephone Encounter (Signed)
 Called patient to reschedule initial pharmacist outreach. Patient noted at time of initial appt that he was unable to complete appt during business hours due to work. Tried calling today to schedule him with our after hours clinic, however there was no answer and no option to leave a voicemail.  Darrelyn Drum, PharmD, BCPS, CPP Clinical Pharmacist Practitioner [facility] [phone]

## 2024-04-10 ENCOUNTER — Telehealth: Payer: Self-pay | Admitting: *Deleted

## 2024-04-10 NOTE — Progress Notes (Signed)
 Complex Care Management Care Guide Note  04/10/2024 Name: KOLTIN WEHMEYER MRN: [id number] DOB: [birth year]  Emma J Sproule is a 53 y.o. year old male who is a primary care patient of Joshua Debby CROME, MD and is actively engaged with the care management team. I reached out to Crystian J Haliburton by phone today to assist with re-scheduling  with the Pharmacist.  Follow up plan: Unsuccessful telephone outreach attempt made. A HIPAA compliant phone message was left for the patient providing contact information and requesting a return call. No further outreach attempts will be made due to inability to maintain patient contact.   Thedford Franks, CMA, Care Guid [facility]  [facility], [facility] Guide Direct Dial: [phone]  Fax: [phone] Website: [email or web address]

## 2024-04-18 NOTE — Addendum Note (Signed)
 Addended by: Anaissa Macfadden T on: 04/18/2024 02:09 PM   Modules accepted: Orders

## 2024-04-23 ENCOUNTER — Other Ambulatory Visit: Payer: Self-pay

## 2024-04-23 ENCOUNTER — Telehealth: Payer: Self-pay

## 2024-04-23 NOTE — Progress Notes (Signed)
 Attempted to call patient 3x for pharmacist telephone visit. LVM for patient to call back. Sent MyChart message to patient to schedule a new appointment time that works with his schedule.  Jenkins Graces, PharmD PGY1 Pharmacy Resident [phone]

## 2024-05-12 ENCOUNTER — Other Ambulatory Visit: Payer: Self-pay | Admitting: Internal Medicine

## 2024-05-12 DIAGNOSIS — I10 Essential (primary) hypertension: Secondary | ICD-10-CM

## 2024-09-06 ENCOUNTER — Other Ambulatory Visit: Payer: Self-pay | Admitting: Internal Medicine

## 2024-09-06 DIAGNOSIS — I10 Essential (primary) hypertension: Secondary | ICD-10-CM

## 2024-12-02 ENCOUNTER — Encounter: Admitting: Family Medicine
